# Patient Record
Sex: Male | Born: 1940 | Race: Black or African American | Hispanic: No | Marital: Married | Smoking: Former smoker
Health system: Southern US, Community
[De-identification: ages and names within clinical notes are randomized; demographics above are authoritative.]

## PROBLEM LIST (undated history)

## (undated) DIAGNOSIS — G629 Polyneuropathy, unspecified: Secondary | ICD-10-CM

## (undated) DIAGNOSIS — R569 Unspecified convulsions: Secondary | ICD-10-CM

## (undated) DIAGNOSIS — G40909 Epilepsy, unspecified, not intractable, without status epilepticus: Secondary | ICD-10-CM

## (undated) HISTORY — PX: KNEE SURGERY: SHX244

## (undated) HISTORY — DX: Epilepsy, unspecified, not intractable, without status epilepticus: G40.909

## (undated) HISTORY — PX: PENECTOMY: SHX741

## (undated) HISTORY — PX: CATARACT EXTRACTION, BILATERAL: SHX1313

## (undated) HISTORY — DX: Unspecified convulsions: R56.9

## (undated) HISTORY — DX: Polyneuropathy, unspecified: G62.9

## (undated) HISTORY — PX: TOE SURGERY: SHX1073

## (undated) HISTORY — PX: SPINE SURGERY: SHX786

## (undated) HISTORY — PX: REPAIR OF PERFORATED ULCER: SHX6065

---

## 2015-08-20 ENCOUNTER — Emergency Department (HOSPITAL_COMMUNITY): Payer: Medicare PPO

## 2015-08-20 ENCOUNTER — Emergency Department (HOSPITAL_COMMUNITY)
Admission: EM | Admit: 2015-08-20 | Discharge: 2015-08-20 | Disposition: A | Discharge status: Home / Self Care | Payer: Medicare PPO | Attending: Emergency Medicine | Admitting: Emergency Medicine

## 2015-08-20 ENCOUNTER — Encounter (HOSPITAL_COMMUNITY): Payer: Self-pay

## 2015-08-20 DIAGNOSIS — M1711 Unilateral primary osteoarthritis, right knee: Secondary | ICD-10-CM | POA: Insufficient documentation

## 2015-08-20 DIAGNOSIS — M25561 Pain in right knee: Secondary | ICD-10-CM

## 2015-08-20 MED ORDER — IBUPROFEN 400 MG PO TABS
600.0000 mg | ORAL_TABLET | Freq: Once | ORAL | Status: AC
  Administered 2015-08-20: 600 mg via ORAL
  Filled 2015-08-20: qty 1

## 2015-08-20 MED ORDER — IBUPROFEN 600 MG PO TABS: 600.0000 mg | ORAL_TABLET | Freq: Four times a day (QID) | ORAL | 0 refills | Status: DC | PRN

## 2015-08-20 NOTE — ED Notes (Signed)
Pt's been called twice without a response. Pt will be moved back to the waiting room.

## 2015-08-20 NOTE — ED Notes (Signed)
Pt has been called three times. Radiology called and informed Nurse 1st that pt was overheard stating that he was going to visit his wife on the 5th floor. Pt will remain in the waiting room.

## 2015-08-20 NOTE — Discharge Instructions (Signed)

## 2015-08-20 NOTE — ED Triage Notes (Signed)
Patient here with 2 weeks of right knee pain, denies injury. Stats he has neuropathy to lower extremities, mild swelling noted

## 2015-08-20 NOTE — ED Provider Notes (Signed)
[facility] DEPT Provider Note   CSN: [id number] Arrival date & time: 08/20/15  1714  First Provider Contact:  First MD Initiated Contact with Patient 08/20/15 1853     History   Chief Complaint Chief Complaint  Patient presents with  . Knee Pain    HPI Mario Waters is a 75 y.o. male.  The history is provided by the patient. No language interpreter was used.  Knee Pain   This is a chronic problem. The current episode started more than 1 week ago. The problem occurs constantly. The problem has been gradually worsening. The pain is present in the right knee. The quality of the pain is described as aching. The pain is at a severity of 5/10. The pain is moderate. Pertinent negatives include no numbness, full range of motion, no stiffness, no tingling and no itching. He has tried nothing for the symptoms. The treatment provided mild relief. There has been no history of extremity trauma.    History reviewed. No pertinent past medical history.  There are no active problems to display for this patient.   History reviewed. No pertinent surgical history.     Home Medications    Prior to Admission medications   Medication Sig Start Date End Date Taking? Authorizing Provider  ibuprofen (ADVIL,MOTRIN) 600 MG tablet Take 1 tablet (600 mg total) by mouth every 6 (six) hours as needed. 08/20/15   Dan Humphreys, MD    Family History No family history on file.  Social History Social History  Substance Use Topics  . Smoking status: Never Smoker  . Smokeless tobacco: Never Used  . Alcohol use Not on file     Allergies   Review of patient's allergies indicates no known allergies.   Review of Systems Review of Systems  Constitutional: Negative for chills and fever.  HENT: Negative for ear pain and sore throat.   Eyes: Negative for pain and visual disturbance.  Respiratory: Negative for cough and shortness of breath.   Cardiovascular: Negative for chest pain and  palpitations.  Gastrointestinal: Negative for abdominal pain and vomiting.  Genitourinary: Negative for dysuria and hematuria.  Musculoskeletal: Positive for joint swelling. Negative for arthralgias, back pain and stiffness.  Skin: Negative for color change, itching and rash.  Neurological: Negative for tingling, seizures, syncope and numbness.  All other systems reviewed and are negative.    Physical Exam Updated Vital Signs BP 129/67 (BP Location: Right Arm)   Pulse 64   Temp 98.4 F (36.9 C) (Oral)   Resp 18   SpO2 100%   Physical Exam  Constitutional: He appears well-developed and well-nourished.  HENT:  Head: Normocephalic and atraumatic.  Eyes: Conjunctivae are normal.  Neck: Neck supple.  Cardiovascular: Normal rate and regular rhythm.   No murmur heard. Pulmonary/Chest: Effort normal and breath sounds normal. No respiratory distress.  Abdominal: Soft. There is no tenderness.  Musculoskeletal: He exhibits no edema.       Right knee: He exhibits swelling and effusion. He exhibits normal range of motion, no deformity and no laceration.  Neurological: He is alert.  Skin: Skin is warm and dry.  Psychiatric: He has a normal mood and affect.  Nursing note and vitals reviewed.    ED Treatments / Results  Labs (all labs ordered are listed, but only abnormal results are displayed) Labs Reviewed - No data to display  EKG  EKG Interpretation None       Radiology Dg Knee Complete 4 Views Right  Result Date: 08/20/2015  CLINICAL DATA:  Right knee pain for 3 weeks, no known injury, initial encounter EXAM: RIGHT KNEE - COMPLETE 4+ VIEW COMPARISON:  None. FINDINGS: Degenerative changes are noted most marked in the medial joint space with osteophytic change and joint space narrowing. No acute fracture or dislocation is seen. Small joint effusion is noted. Diffuse vascular calcifications are seen. IMPRESSION: Degenerative change without acute abnormality. Electronically  Signed   By: Alcide Clever M.D.   On: 08/20/2015 18:04   Procedures Procedures (including critical care time)  Medications Ordered in ED Medications  ibuprofen (ADVIL,MOTRIN) tablet 600 mg (600 mg Oral Given 08/20/15 1908)     Initial Impression / Assessment and Plan / ED Course  I have reviewed the triage vital signs and the nursing notes.  Pertinent labs & imaging results that were available during my care of the patient were reviewed by me and considered in my medical decision making (see chart for details).  Clinical Course   Patient is a 75 year old gentleman with no significant past medical history with evaluation of right knee pain. He hit the pain is worsening over the past 2 weeks. No associated fall or trauma. Patient denies any fevers, chills. He does pain is worse with activity and worse near the end of the day.  Patient was told in the past and had a history of "bone on bone".  His exam reveals unremarkable vital signs. Patient is alert, oriented in no distress. Small joint effusion. No warmth to palpation. Patient with full range of motion of the joint.  X-ray with no acute findings. Right knee x-ray with changes consistent with osteoarthritis. No signs of acute fracture or malalignment.  No fever, warmth, restricted joint movement on range of motion to suggest septic joint.  Encouraged NSAIDs, rest, ice and referred to orthopedics.  Patient able to ambulate in ED at time of discharge.  Discussed case my attending, Dr. Adela Lank.  Final Clinical Impressions(s) / ED Diagnoses   Final diagnoses:  Right knee pain  Osteoarthritis of right knee, unspecified osteoarthritis type    New Prescriptions New Prescriptions   IBUPROFEN (ADVIL,MOTRIN) 600 MG TABLET    Take 1 tablet (600 mg total) by mouth every 6 (six) hours as needed.     Dan Humphreys, MD 08/20/15 1912    Melene Plan, DO 08/21/15 9562

## 2015-09-11 ENCOUNTER — Telehealth: Payer: Self-pay

## 2015-09-11 DIAGNOSIS — K219 Gastro-esophageal reflux disease without esophagitis: Secondary | ICD-10-CM | POA: Diagnosis not present

## 2015-09-11 DIAGNOSIS — G40909 Epilepsy, unspecified, not intractable, without status epilepticus: Secondary | ICD-10-CM | POA: Diagnosis not present

## 2015-09-11 DIAGNOSIS — Z23 Encounter for immunization: Secondary | ICD-10-CM | POA: Diagnosis not present

## 2015-09-11 DIAGNOSIS — G5793 Unspecified mononeuropathy of bilateral lower limbs: Secondary | ICD-10-CM | POA: Diagnosis not present

## 2015-09-11 NOTE — Telephone Encounter (Signed)
Pt stopped by the office to have medications recorded into his chart. Pt coming for NP appt on 09/12/2015 RLB

## 2015-09-12 ENCOUNTER — Institutional Professional Consult (permissible substitution): Payer: Self-pay | Admitting: Family Medicine

## 2015-09-14 ENCOUNTER — Other Ambulatory Visit: Payer: Self-pay | Admitting: Family Medicine

## 2015-09-21 ENCOUNTER — Encounter: Payer: Self-pay | Admitting: Family Medicine

## 2015-09-21 ENCOUNTER — Other Ambulatory Visit: Payer: Self-pay | Admitting: Family Medicine

## 2015-09-21 ENCOUNTER — Telehealth: Payer: Self-pay | Admitting: Internal Medicine

## 2015-09-21 ENCOUNTER — Ambulatory Visit (INDEPENDENT_AMBULATORY_CARE_PROVIDER_SITE_OTHER): Payer: Medicare PPO | Admitting: Family Medicine

## 2015-09-21 ENCOUNTER — Telehealth: Payer: Self-pay

## 2015-09-21 VITALS — BP 124/72 | HR 77 | Wt 154.6 lb

## 2015-09-21 DIAGNOSIS — G40909 Epilepsy, unspecified, not intractable, without status epilepticus: Secondary | ICD-10-CM | POA: Diagnosis not present

## 2015-09-21 DIAGNOSIS — G5793 Unspecified mononeuropathy of bilateral lower limbs: Secondary | ICD-10-CM | POA: Diagnosis not present

## 2015-09-21 DIAGNOSIS — T420X1A Poisoning by hydantoin derivatives, accidental (unintentional), initial encounter: Secondary | ICD-10-CM | POA: Insufficient documentation

## 2015-09-21 DIAGNOSIS — Z23 Encounter for immunization: Secondary | ICD-10-CM | POA: Diagnosis not present

## 2015-09-21 DIAGNOSIS — K219 Gastro-esophageal reflux disease without esophagitis: Secondary | ICD-10-CM

## 2015-09-21 DIAGNOSIS — G629 Polyneuropathy, unspecified: Secondary | ICD-10-CM | POA: Insufficient documentation

## 2015-09-21 LAB — CBC WITH DIFFERENTIAL/PLATELET
BASOS ABS: 43 {cells}/uL (ref 0–200)
Basophils Relative: 1 %
Eosinophils Absolute: 43 cells/uL (ref 15–500)
Eosinophils Relative: 1 %
HEMATOCRIT: 41.9 % (ref 38.5–50.0)
HEMOGLOBIN: 14.4 g/dL (ref 13.2–17.1)
LYMPHS ABS: 2279 {cells}/uL (ref 850–3900)
LYMPHS PCT: 53 %
MCH: 31.8 pg (ref 27.0–33.0)
MCHC: 34.4 g/dL (ref 32.0–36.0)
MCV: 92.5 fL (ref 80.0–100.0)
MONO ABS: 344 {cells}/uL (ref 200–950)
MPV: 9.8 fL (ref 7.5–12.5)
Monocytes Relative: 8 %
NEUTROS PCT: 37 %
Neutro Abs: 1591 cells/uL (ref 1500–7800)
Platelets: 272 10*3/uL (ref 140–400)
RBC: 4.53 MIL/uL (ref 4.20–5.80)
RDW: 13.7 % (ref 11.0–15.0)
WBC: 4.3 10*3/uL (ref 4.0–10.5)

## 2015-09-21 LAB — COMPREHENSIVE METABOLIC PANEL
ALBUMIN: 4.5 g/dL (ref 3.6–5.1)
ALT: 19 U/L (ref 9–46)
AST: 22 U/L (ref 10–35)
Alkaline Phosphatase: 89 U/L (ref 40–115)
BILIRUBIN TOTAL: 0.5 mg/dL (ref 0.2–1.2)
BUN: 10 mg/dL (ref 7–25)
CALCIUM: 9.4 mg/dL (ref 8.6–10.3)
CHLORIDE: 102 mmol/L (ref 98–110)
CO2: 29 mmol/L (ref 20–31)
Creat: 0.95 mg/dL (ref 0.70–1.18)
GLUCOSE: 106 mg/dL — AB (ref 65–99)
POTASSIUM: 4.8 mmol/L (ref 3.5–5.3)
Sodium: 142 mmol/L (ref 135–146)
Total Protein: 7.6 g/dL (ref 6.1–8.1)

## 2015-09-21 NOTE — Telephone Encounter (Signed)
error 

## 2015-09-21 NOTE — Telephone Encounter (Signed)
Given to vicki for urgent medical records request.

## 2015-09-21 NOTE — Telephone Encounter (Signed)
oked per vickie to refill for 90 days

## 2015-09-21 NOTE — Progress Notes (Signed)
Subjective:    Patient ID: Mario Waters, male    DOB: [birth year], 75 y.o.   MRN: [id number]  HPI Chief Complaint  Patient presents with  . Other    new pt, get established- out of seizure meds and needs refill   He is new to the practice and here to establish care. No medical records were received prior to his visit and he did not bring in medical records. He does have prescription bottles including an empty bottle of Dilantin that has refills on the bottle. States he took his last dose last night. This medication was prescribed by his neurologist in [facility].   He moved here from [facility] with his wife in July.  Previous medical care: Dr. Ladell Pier in [facility] was PCP.  Last CPE: last year  Other providers: Dr. Adele Barthel in [facility] is neurosurgeon. Had spinal surgery in 02/2014. States he has been on Gabapentin since then.  Dr. Eduardo Osier - neurologist   GERD and history of bleeding ulcers, had surgery in 1980s in [place]. No problems since.  Takes medication for reflux but cannot recall which medication. Has been out of this for several weeks. Denies having any reflux symptoms.   History of seizures. States last seizure was June 2017 in [place]. First seizure was in 1990s and states he has been taking medication since then.  States he had a workup for seizures and cannot tell me what his diagnosis is. States he has 1 seizure per year typically.  Has been taking daily Phenytoin 200 mg nightly.  States he does not know why he is taking Gabapentin. Has been taking it since his last spinal surgery.  States he had neuropathy and medication has helped with pain in hands but not for feet and leg pains.   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   Quit smoking in 1980. Smoked for 20 years, 1 ppd. denies drinking alcohol and drug use  Denies fever, chills, headache,  dizziness, vision changes, chest pain, palpitations, DOE, abdominal pain, nausea, vomiting, diarrhea, LE edema.   Reviewed allergies, medications, past medical, surgical, and social history.   Review of Systems Pertinent positives and negatives in the history of present illness.     Objective:   Physical Exam  Constitutional: He is oriented to person, place, and time. He appears well-developed and well-nourished. No distress.  HENT:  Mouth/Throat: Uvula is midline, oropharynx is clear and moist and mucous membranes are normal.  Eyes: Conjunctivae and EOM are normal. Pupils are equal, round, and reactive to light.  Neck: Normal range of motion. Neck supple.  Cardiovascular: Normal rate, regular rhythm, normal heart sounds and normal pulses.  Exam reveals no gallop and no friction rub.   No murmur heard. Pulmonary/Chest: Effort normal and breath sounds normal.  Lymphadenopathy:    He has no cervical adenopathy.       Right: No supraclavicular adenopathy present.       Left: No supraclavicular adenopathy present.  Neurological: He is alert and oriented to person, place, and time. He has normal strength and normal reflexes. He displays no tremor. No cranial nerve deficit or sensory deficit. Coordination and gait normal.  Skin: Skin is warm and dry. No rash noted. No cyanosis. No pallor.  Psychiatric: He has a normal mood and affect. His speech is normal and behavior is normal. Judgment and thought content normal. Cognition  and memory are normal.   BP 124/72   Pulse 77   Wt 154 lb 9.6 oz (70.1 kg)      Assessment & Plan:  Seizure disorder (HCC) - Plan: Ambulatory referral to Neurology  Neuropathy involving both lower extremities (HCC) - Plan: Ambulatory referral to Neurology  Gastroesophageal reflux disease, esophagitis presence not specified  Dilantin was refilled to [facility] per Vernona RiegerLaura. Patient given instructions.  Referral made to neurologist.  Plan to check dilantin  level and CBC, CMP.  Continue on gabapentin for neuropathy.  Flu shot given.  GERD- recommend that he not start daily medication as long as he is not having any issues with this.  [facility] received from [facility]ist [facility] after patient left office and shows history of dilantin toxicity causing seizure in past.  Will need to discuss him not driving when we call with lab results. He should not drive until evaluated by neurologist and for 6 months following last seizure which will be in December 2017.  Follow up pending labs and when I get medical records [facility].

## 2015-09-22 LAB — PHENYTOIN LEVEL, TOTAL: Phenytoin Lvl: 21.2 ug/mL — ABNORMAL HIGH (ref 10.0–20.0)

## 2015-10-02 ENCOUNTER — Telehealth: Payer: Self-pay | Admitting: Family Medicine

## 2015-10-02 NOTE — Telephone Encounter (Signed)
Rcvd office notes and labs from Dr Casey BurkittLynn Goetze

## 2015-10-05 ENCOUNTER — Telehealth: Payer: Self-pay | Admitting: Family Medicine

## 2015-10-05 DIAGNOSIS — N429 Disorder of prostate, unspecified: Secondary | ICD-10-CM

## 2015-10-05 DIAGNOSIS — R7889 Finding of other specified substances, not normally found in blood: Secondary | ICD-10-CM

## 2015-10-05 NOTE — Telephone Encounter (Signed)
Please call and have him come in for a lab visit next Monday or Tuesday for repeat Dilantin level due to elevated dilantin level. Also, please fine out why he is requesting that I send him to the urologist for prostate issues. What symptoms is he having and what history does he have for me to send him? I did not see any notes on this. I should see him again soon if he is having symptoms so I can see if he needs a referral or not

## 2015-10-05 NOTE — Telephone Encounter (Signed)
Spoke to patient and he states that several years ago he had to have surgery on his prostate as it was swollen and had to take an abscess out. [facility] just to make sure everything is ok but wants to have a urologist up here instead. Is it okay to refer him to urology. No symptoms and no concerns. I have put in future dilantin order in

## 2015-10-05 NOTE — Addendum Note (Signed)
Addended by: Herminio CommonsJOHNSON, SABRINA A on: 10/05/2015 09:12 AM   Modules accepted: Orders

## 2015-10-09 ENCOUNTER — Other Ambulatory Visit: Payer: Medicare PPO

## 2015-10-10 NOTE — Telephone Encounter (Signed)
Sure, he can be referred to urology for this. Thanks.

## 2015-10-11 NOTE — Telephone Encounter (Signed)
Tried to call patient but mailbox is full. I will go ahead and refer patient to urology for prostate

## 2015-10-11 NOTE — Addendum Note (Signed)
Addended by: Herminio CommonsJOHNSON, Truth Wolaver A on: 10/11/2015 09:58 AM   Modules accepted: Orders

## 2015-10-11 NOTE — Telephone Encounter (Signed)
Faxed over referral to [facility]

## 2015-10-20 ENCOUNTER — Telehealth: Payer: Self-pay | Admitting: Family Medicine

## 2015-10-20 MED ORDER — PHENYTOIN SODIUM EXTENDED 100 MG PO CAPS: 200.0000 mg | ORAL_CAPSULE | Freq: Every day | ORAL | 0 refills | Status: DC

## 2015-10-20 MED ORDER — CLONAZEPAM 0.5 MG PO TABS: 0.5000 mg | ORAL_TABLET | Freq: Every day | ORAL | 0 refills | Status: DC

## 2015-10-20 NOTE — Telephone Encounter (Signed)
Pt was notified that he needs to come in for an appt for lab. And also call neurology again to schedule appt. I will give that neurology appt on Monday when he comes in for labs. I will also call in meds to pharmacy  [facility] (Seizures).Please call them at [phone] to schedule appointment.   Address: [address]                [place], [place] [address]

## 2015-10-20 NOTE — Telephone Encounter (Signed)
Pt stopped by office, he needs refill on Phenytoin sodium 100 States he only has enough for tonight and also Questioned why he did not have refills  Also needs refill on clonazepam 0.5 mg #30  [facility]

## 2015-10-20 NOTE — Telephone Encounter (Signed)
He did not come back to have his dilantin level checked as requested in order to continue refilling this for him. His level was high last time it was checked. He needs to come in for this. I will give him one refill for now and I cannot refill it going further until he gets this checked. Also, please check and make sure he has an appointment set up to see the neurologist. I would am referring him because of recent seizure on this medication. He should not be driving and I discussed this with him.  Please send in one refill.

## 2015-10-20 NOTE — Telephone Encounter (Signed)
Called in med to pharmacy  

## 2015-10-23 ENCOUNTER — Other Ambulatory Visit: Payer: Medicare PPO

## 2015-10-23 DIAGNOSIS — R7889 Finding of other specified substances, not normally found in blood: Secondary | ICD-10-CM

## 2015-10-26 LAB — PHENYTOIN LEVEL, FREE AND TOTAL: PHENYTOIN FREE: 1.8 mg/L (ref 1.0–2.0)

## 2015-11-06 ENCOUNTER — Telehealth: Payer: Self-pay | Admitting: Internal Medicine

## 2015-11-06 NOTE — Telephone Encounter (Signed)
Pt was told today in office that we are not going to refill his dilantin anymore that his neurologist needs to fill it. [facility] says that [facility].  I told him if not he need to call Neurology here (which would be [facility] that we referred him too) to get an appt.

## 2015-11-13 ENCOUNTER — Encounter: Payer: Self-pay | Admitting: Family Medicine

## 2015-11-15 ENCOUNTER — Encounter: Payer: Self-pay | Admitting: Family Medicine

## 2016-01-29 ENCOUNTER — Ambulatory Visit (INDEPENDENT_AMBULATORY_CARE_PROVIDER_SITE_OTHER): Payer: Medicare PPO | Admitting: Neurology

## 2016-01-29 ENCOUNTER — Encounter: Payer: Self-pay | Admitting: Neurology

## 2016-01-29 VITALS — BP 133/72 | HR 69 | Ht 71.0 in | Wt 159.0 lb

## 2016-01-29 DIAGNOSIS — Z5181 Encounter for therapeutic drug level monitoring: Secondary | ICD-10-CM

## 2016-01-29 DIAGNOSIS — G5793 Unspecified mononeuropathy of bilateral lower limbs: Secondary | ICD-10-CM

## 2016-01-29 DIAGNOSIS — G40909 Epilepsy, unspecified, not intractable, without status epilepticus: Secondary | ICD-10-CM | POA: Diagnosis not present

## 2016-01-29 NOTE — Patient Instructions (Signed)
   Begin taking Vitamin D 2000 IU daily.

## 2016-01-29 NOTE — Progress Notes (Signed)
Reason for visit: Seizures  Referring physician: Dr. Bonnielee Haff Mario Waters is a 76 y.o. male  History of present illness:  Mario Waters is a 76 year old right-handed black male with a history of seizures that began in the 46s. The patient has recently moved from the [place], [place] area in July 2017. The patient had been followed through a neurologist in that area until just recently. The patient last had a seizure in May 2017, it had been about 2 years prior since he had a seizure. He has been treated with Dilantin for the seizures, he is also on gabapentin for a peripheral neuropathy. He does operate a motor vehicle without difficulty. He indicates that he gets a sensation of generalized weakness prior to onset of the seizure, then he will lose consciousness with generalized jerking. He denies having any problems with tongue biting or incontinence of the bowels or the bladder. He reports no focal numbness or weakness of the face, arms, or legs. He does have some discomfort in the legs, and he has some gait instability. He denies any family history of seizures. He is sent to this office for further evaluation. A Dilantin level done in August 2017 was slightly toxic at 21, but a free level was 1.8.  Past Medical History:  Diagnosis Date  . Neuropathy (HCC)   . Seizure disorder (HCC)    since 14s. taking Dilantin 200mg  nightly    Past Surgical History:  Procedure Laterality Date  . CATARACT EXTRACTION, BILATERAL    . KNEE SURGERY    . PENECTOMY    . REPAIR OF PERFORATED ULCER    . SPINE SURGERY     Cervical laminectomy  . TOE SURGERY      Family History  Problem Relation Age of Onset  . Cancer Sister     breast  . Cancer Sister   . Seizures Neg Hx     Social history:  reports that he quit smoking about 38 years ago. He has never used smokeless tobacco. He reports that he does not drink alcohol or use drugs.  Medications:  Prior to Admission medications     Medication Sig Start Date End Date Taking? Authorizing Provider  Cholecalciferol (VITAMIN D) 2000 units CAPS Take 2,000 Units by mouth daily.   Yes Historical Provider, MD  clonazePAM (KLONOPIN) 0.5 MG tablet Take 1 tablet (0.5 mg total) by mouth at bedtime. 10/20/15  Yes Avanell Shackleton, NP  gabapentin (NEURONTIN) 300 MG capsule Take 300 mg by mouth 2 (two) times daily.   Yes Historical Provider, MD  ibuprofen (ADVIL,MOTRIN) 600 MG tablet Take 1 tablet (600 mg total) by mouth every 6 (six) hours as needed. 08/20/15  Yes Dan Humphreys, MD  Multiple Vitamins-Minerals (PRESERVISION AREDS 2) CAPS Take 2 capsules by mouth.   Yes Historical Provider, MD  Omega-3 Fatty Acids (OMEGA-3 FISH OIL) 300 MG CAPS Take by mouth daily.   Yes Historical Provider, MD  phenytoin (DILANTIN) 100 MG ER capsule Take 2 capsules (200 mg total) by mouth at bedtime. 10/20/15  Yes Avanell Shackleton, NP     No Known Allergies  ROS:  Out of a complete 14 system review of symptoms, the patient complains only of the following symptoms, and all other reviewed systems are negative.  Snoring Joint pain, achy muscles Numbness, weakness, seizure  Blood pressure 133/72, pulse 69, height 5\' 11"  (1.803 m), weight 159 lb (72.1 kg).  Physical Exam  General: The patient is alert and  cooperative at the time of the examination.  Eyes: Pupils are equal, round, and reactive to light. Discs are flat bilaterally.  Neck: The neck is supple, no carotid bruits are noted.  Respiratory: The respiratory examination is clear.  Cardiovascular: The cardiovascular examination reveals a regular rate and rhythm, no obvious murmurs or rubs are noted.  Skin: Extremities are without significant edema.  Neurologic Exam  Mental status: The patient is alert and oriented x 3 at the time of the examination. The patient has apparent normal recent and remote memory, with an apparently normal attention span and concentration ability.  Cranial nerves:  Facial symmetry is present. There is good sensation of the face to pinprick and soft touch bilaterally. The strength of the facial muscles and the muscles to head turning and shoulder shrug are normal bilaterally. Speech is well enunciated, no aphasia or dysarthria is noted. Extraocular movements are full. Visual fields are full. The tongue is midline, and the patient has symmetric elevation of the soft palate. No obvious hearing deficits are noted.  Motor: The motor testing reveals 5 over 5 strength of all 4 extremities. Good symmetric motor tone is noted throughout.  Sensory: Sensory testing is intact to pinprick, soft touch, vibration sensation, and position sense on all 4 extremities, with exception of some decreased edition sense in both feet. No evidence of extinction is noted.  Coordination: Cerebellar testing reveals good finger-nose-finger and heel-to-shin bilaterally.  Gait and station: Gait is slightly wide-based. Tandem gait is unsteady. Romberg is negative. No drift is seen.  Reflexes: Deep tendon reflexes are symmetric, but are depressed bilaterally. Toes are downgoing bilaterally.   Assessment/Plan:  1. History seizures  2. Peripheral neuropathy  3. Mild gait instability  The patient will be sent for blood work today. He will go on vitamin D supplementation while he is on Dilantin. He is still operating a motor vehicle. He will follow-up in about 6 months. He will contact our office if a seizure recurrence is noted.  Mario Waters. Mario Willis MD 01/29/2016 2:53 PM  [facility] [address] [place], [place] [id number]  Phone [phone] Fax [phone]

## 2016-01-30 ENCOUNTER — Telehealth: Payer: Self-pay | Admitting: Neurology

## 2016-01-30 LAB — CBC WITH DIFFERENTIAL/PLATELET
BASOS: 1 %
Basophils Absolute: 0 10*3/uL (ref 0.0–0.2)
EOS (ABSOLUTE): 0.1 10*3/uL (ref 0.0–0.4)
Eos: 2 %
Hematocrit: 39.2 % (ref 37.5–51.0)
Hemoglobin: 13.2 g/dL (ref 13.0–17.7)
Immature Grans (Abs): 0 10*3/uL (ref 0.0–0.1)
Immature Granulocytes: 0 %
Lymphocytes Absolute: 2.6 10*3/uL (ref 0.7–3.1)
Lymphs: 49 %
MCH: 31.1 pg (ref 26.6–33.0)
MCHC: 33.7 g/dL (ref 31.5–35.7)
MCV: 92 fL (ref 79–97)
MONOS ABS: 0.4 10*3/uL (ref 0.1–0.9)
Monocytes: 8 %
NEUTROS ABS: 2.1 10*3/uL (ref 1.4–7.0)
Neutrophils: 40 %
PLATELETS: 272 10*3/uL (ref 150–379)
RBC: 4.25 x10E6/uL (ref 4.14–5.80)
RDW: 14 % (ref 12.3–15.4)
WBC: 5.3 10*3/uL (ref 3.4–10.8)

## 2016-01-30 LAB — PHENYTOIN LEVEL, TOTAL: Phenytoin (Dilantin), Serum: 28 ug/mL (ref 10.0–20.0)

## 2016-01-30 LAB — COMPREHENSIVE METABOLIC PANEL
A/G RATIO: 1.7 (ref 1.2–2.2)
ALT: 19 IU/L (ref 0–44)
AST: 23 IU/L (ref 0–40)
Albumin: 4.7 g/dL (ref 3.5–4.8)
Alkaline Phosphatase: 100 IU/L (ref 39–117)
BUN/Creatinine Ratio: 11 (ref 10–24)
BUN: 9 mg/dL (ref 8–27)
Bilirubin Total: 0.4 mg/dL (ref 0.0–1.2)
CALCIUM: 9 mg/dL (ref 8.6–10.2)
CO2: 27 mmol/L (ref 18–29)
Chloride: 97 mmol/L (ref 96–106)
Creatinine, Ser: 0.79 mg/dL (ref 0.76–1.27)
GFR, EST AFRICAN AMERICAN: 100 mL/min/{1.73_m2} (ref >59)
GFR, EST NON AFRICAN AMERICAN: 87 mL/min/{1.73_m2} (ref >59)
GLOBULIN, TOTAL: 2.8 g/dL (ref 1.5–4.5)
Glucose: 86 mg/dL (ref 65–99)
POTASSIUM: 4.9 mmol/L (ref 3.5–5.2)
SODIUM: 140 mmol/L (ref 134–144)
TOTAL PROTEIN: 7.5 g/dL (ref 6.0–8.5)

## 2016-01-30 MED ORDER — PHENYTOIN SODIUM EXTENDED 100 MG PO CAPS: 100.0000 mg | ORAL_CAPSULE | Freq: Every day | ORAL | 0 refills | Status: DC

## 2016-01-30 MED ORDER — PHENYTOIN 50 MG PO CHEW: 50.0000 mg | CHEWABLE_TABLET | Freq: Every day | ORAL | 3 refills | Status: DC

## 2016-01-30 NOTE — Telephone Encounter (Signed)
I called patient. The patient had a Dilantin level of 28, this does represent a trough level as he takes his medications in the evening hours. The patient was slightly staggery on clinical examination. We will cut him back to 150 mg of Dilantin at night, recheck blood levels in 3 weeks. I have called in the 50 mg tablets, I talked with the patient concerning the dose change.

## 2016-02-20 ENCOUNTER — Telehealth: Payer: Self-pay | Admitting: Neurology

## 2016-02-20 DIAGNOSIS — Z5181 Encounter for therapeutic drug level monitoring: Secondary | ICD-10-CM

## 2016-02-20 NOTE — Telephone Encounter (Signed)
I called the patient. He is to come in to get a Dilantin level checked, I will call him with the results when we get them.

## 2016-02-26 ENCOUNTER — Other Ambulatory Visit (INDEPENDENT_AMBULATORY_CARE_PROVIDER_SITE_OTHER): Payer: Self-pay

## 2016-02-26 DIAGNOSIS — Z5181 Encounter for therapeutic drug level monitoring: Secondary | ICD-10-CM

## 2016-02-26 DIAGNOSIS — Z0289 Encounter for other administrative examinations: Secondary | ICD-10-CM

## 2016-02-27 ENCOUNTER — Telehealth: Payer: Self-pay | Admitting: *Deleted

## 2016-02-27 LAB — PHENYTOIN LEVEL, TOTAL: PHENYTOIN (DILANTIN), SERUM: 13.9 ug/mL (ref 10.0–20.0)

## 2016-02-27 NOTE — Telephone Encounter (Signed)
Called and spoke to pt about lab results per CW,MD note. He verbalized understanding.

## 2016-02-27 NOTE — Telephone Encounter (Signed)
-----   Message from York Spanielharles K Willis, MD sent at 02/27/2016  7:24 AM EST -----  Dilantin level is therapeutic, no change in dosing. Please call the patient. ----- Message ----- From: Nell RangeInterface, [facility] Lab Results In Sent: 02/27/2016   5:39 AM To: York Spanielharles K Willis, MD

## 2016-05-14 ENCOUNTER — Encounter (HOSPITAL_COMMUNITY): Payer: Self-pay | Admitting: *Deleted

## 2016-05-14 ENCOUNTER — Emergency Department (HOSPITAL_COMMUNITY)
Admission: EM | Admit: 2016-05-14 | Discharge: 2016-05-14 | Disposition: A | Discharge status: Home / Self Care | Payer: Medicare PPO | Attending: Emergency Medicine | Admitting: Emergency Medicine

## 2016-05-14 ENCOUNTER — Emergency Department (HOSPITAL_COMMUNITY): Payer: Medicare PPO

## 2016-05-14 DIAGNOSIS — Z87891 Personal history of nicotine dependence: Secondary | ICD-10-CM | POA: Insufficient documentation

## 2016-05-14 DIAGNOSIS — G40909 Epilepsy, unspecified, not intractable, without status epilepticus: Secondary | ICD-10-CM | POA: Insufficient documentation

## 2016-05-14 DIAGNOSIS — R030 Elevated blood-pressure reading, without diagnosis of hypertension: Secondary | ICD-10-CM | POA: Insufficient documentation

## 2016-05-14 DIAGNOSIS — R4182 Altered mental status, unspecified: Secondary | ICD-10-CM | POA: Diagnosis not present

## 2016-05-14 DIAGNOSIS — E876 Hypokalemia: Secondary | ICD-10-CM | POA: Diagnosis not present

## 2016-05-14 LAB — COMPREHENSIVE METABOLIC PANEL
ALBUMIN: 4.1 g/dL (ref 3.5–5.0)
ALT: 24 U/L (ref 17–63)
ANION GAP: 10 (ref 5–15)
AST: 28 U/L (ref 15–41)
Alkaline Phosphatase: 96 U/L (ref 38–126)
BILIRUBIN TOTAL: 1.1 mg/dL (ref 0.3–1.2)
BUN: 11 mg/dL (ref 6–20)
CO2: 27 mmol/L (ref 22–32)
Calcium: 9 mg/dL (ref 8.9–10.3)
Chloride: 100 mmol/L — ABNORMAL LOW (ref 101–111)
Creatinine, Ser: 0.98 mg/dL (ref 0.61–1.24)
GFR calc Af Amer: >60 mL/min (ref >60)
Glucose, Bld: 96 mg/dL (ref 65–99)
POTASSIUM: 3.4 mmol/L — AB (ref 3.5–5.1)
Sodium: 137 mmol/L (ref 135–145)
TOTAL PROTEIN: 7.4 g/dL (ref 6.5–8.1)

## 2016-05-14 LAB — CBC WITH DIFFERENTIAL/PLATELET
BASOS PCT: 1 %
Basophils Absolute: 0 10*3/uL (ref 0.0–0.1)
EOS ABS: 0.1 10*3/uL (ref 0.0–0.7)
EOS PCT: 1 %
HCT: 41.1 % (ref 39.0–52.0)
Hemoglobin: 14 g/dL (ref 13.0–17.0)
LYMPHS PCT: 46 %
Lymphs Abs: 2.3 10*3/uL (ref 0.7–4.0)
MCH: 32 pg (ref 26.0–34.0)
MCHC: 34.1 g/dL (ref 30.0–36.0)
MCV: 93.8 fL (ref 78.0–100.0)
MONO ABS: 0.3 10*3/uL (ref 0.1–1.0)
Monocytes Relative: 7 %
NEUTROS ABS: 2.2 10*3/uL (ref 1.7–7.7)
Neutrophils Relative %: 45 %
Platelets: 243 10*3/uL (ref 150–400)
RBC: 4.38 MIL/uL (ref 4.22–5.81)
RDW: 12.8 % (ref 11.5–15.5)
WBC: 4.9 10*3/uL (ref 4.0–10.5)

## 2016-05-14 LAB — URINALYSIS, ROUTINE W REFLEX MICROSCOPIC
BILIRUBIN URINE: NEGATIVE
GLUCOSE, UA: NEGATIVE mg/dL
HGB URINE DIPSTICK: NEGATIVE
Ketones, ur: 5 mg/dL — AB
Leukocytes, UA: NEGATIVE
Nitrite: NEGATIVE
PROTEIN: NEGATIVE mg/dL
Specific Gravity, Urine: 1.011 (ref 1.005–1.030)
pH: 5 (ref 5.0–8.0)

## 2016-05-14 LAB — PHENYTOIN LEVEL, TOTAL: PHENYTOIN LVL: 14.2 ug/mL (ref 10.0–20.0)

## 2016-05-14 MED ORDER — PHENYTOIN SODIUM EXTENDED 100 MG PO CAPS
300.0000 mg | ORAL_CAPSULE | Freq: Once | ORAL | Status: AC
  Administered 2016-05-14: 300 mg via ORAL
  Filled 2016-05-14: qty 3

## 2016-05-14 MED ORDER — POTASSIUM CHLORIDE CRYS ER 20 MEQ PO TBCR
40.0000 meq | EXTENDED_RELEASE_TABLET | Freq: Once | ORAL | Status: AC
  Administered 2016-05-14: 40 meq via ORAL
  Filled 2016-05-14: qty 2

## 2016-05-14 NOTE — ED Notes (Signed)
Got patient undress on the monitor waiting for provider 

## 2016-05-14 NOTE — ED Provider Notes (Signed)
[facility]EMERGENCY DEPT Provider Note   CSN: [id number] Arrival date & time: 05/14/16  1478     History   Chief Complaint Chief Complaint  Patient presents with  . Altered Mental Status  Level V caveat altered mental status. I attempted to call patient's home numbers, no answer at history stay and from EMS  HPI Mario Waters is a 76 y.o. male. Patient reportedly had 2 seizures sometime between last night and this morning. Patient admits to having one seizure. He denies noncompliance with medications. He denies pain anywhere and states "I feel all right" no treatment prior to coming here  HPI  Past Medical History:  Diagnosis Date  . Neuropathy   . Seizure disorder (HCC)    since 89s. taking Dilantin  nightly    Patient Active Problem List   Diagnosis Date Noted  . Neuropathy involving both lower extremities 09/21/2015  . Seizure disorder (HCC) 09/21/2015  . Dilantin toxicity 09/21/2015    Past Surgical History:  Procedure Laterality Date  . CATARACT EXTRACTION, BILATERAL    . KNEE SURGERY    . PENECTOMY    . REPAIR OF PERFORATED ULCER    . SPINE SURGERY     Cervical laminectomy  . TOE SURGERY         Home Medications    Prior to Admission medications   Medication Sig Start Date End Date Taking? Authorizing Provider  Cholecalciferol (VITAMIN D) 2000 units CAPS Take 2,000 Units by mouth daily.    Historical Provider, MD  clonazePAM (KLONOPIN) 0.5 MG tablet Take 1 tablet (0.5 mg total) by mouth at bedtime. 10/20/15   Vickie L Henson, NP-C  gabapentin (NEURONTIN) 300 MG capsule Take 300 mg by mouth 2 (two) times daily.    Historical Provider, MD  ibuprofen (ADVIL,MOTRIN) 600 MG tablet Take 1 tablet (600 mg total) by mouth every 6 (six) hours as needed. 08/20/15   Dan Humphreys, MD  Multiple Vitamins-Minerals (PRESERVISION AREDS 2) CAPS Take 2 capsules by mouth.    Historical Provider, MD  Omega-3 Fatty Acids (OMEGA-3 FISH OIL) 300 MG CAPS Take by mouth daily.     Historical Provider, MD  phenytoin (DILANTIN) 100 MG ER capsule Take 1 capsule (100 mg total) by mouth at bedtime. 01/30/16   York Spaniel, MD  phenytoin (DILANTIN) 50 MG tablet Chew 1 tablet (50 mg total) by mouth at bedtime. 01/30/16   York Spaniel, MD    Family History Family History  Problem Relation Age of Onset  . Cancer Sister     breast  . Cancer Sister   . Seizures Neg Hx     Social History Social History  Substance Use Topics  . Smoking status: Former Smoker    Quit date: 1980  . Smokeless tobacco: Never Used  . Alcohol use No     Comment: Quit 1980   Denies drug use  Allergies   Patient has no known allergies.   Review of Systems Review of Systems  Unable to perform ROS: Mental status change  Neurological: Positive for seizures.     Physical Exam Updated Vital Signs BP (!) 148/70 (BP Location: Right Arm)   Pulse 87   Temp 97.8 F (36.6 C) (Oral)   Resp 20   SpO2 99%   Physical Exam  Constitutional: He appears well-developed and well-nourished.  HENT:  Head: Normocephalic and atraumatic.  Eyes: Conjunctivae are normal. Pupils are equal, round, and reactive to light.  Neck: Neck supple. No tracheal deviation present.  No thyromegaly present.  Cardiovascular: Normal rate and regular rhythm.   No murmur heard. Pulmonary/Chest: Effort normal and breath sounds normal.  Abdominal: Soft. Bowel sounds are normal. He exhibits no distension. There is no tenderness.  Musculoskeletal: Normal range of motion. He exhibits no edema or tenderness.  Neurological: He is alert. Coordination normal.  Oriented to name and hospital does not know month or year. Cranial nerves II through XII grossly intact. DTRs symmetric bilaterally knee jerk ankle jerk and biceps toes to order bilaterally motor strength grossly 5 over 5 overall follow simple commands  Skin: Skin is warm and dry. No rash noted.  Psychiatric: He has a normal mood and affect.  Nursing note and vitals  reviewed.  ED Treatments / Results  Labs (all labs ordered are listed, but only abnormal results are displayed) Labs Reviewed - No data to display  EKG  EKG Interpretation None      ED ECG REPORT   Date: 05/14/2016  Rate: 90  Rhythm: normal sinus rhythm  QRS Axis: normal  Intervals: normal  ST/T Wave abnormalities: nonspecific T wave changes  Conduction Disutrbances:none  Narrative Interpretation:   Old EKG Reviewed: none available  I have personally reviewed the EKG tracing and agree with the computerized printout as noted. Radiology No results found.  Procedures Procedures (including critical care time)  Medications Ordered in ED Medications - No data to display  Results for orders placed or performed during the hospital encounter of 05/14/16  Comprehensive metabolic panel  Result Value Ref Range   Sodium 137 135 - 145 mmol/L   Potassium 3.4 (L) 3.5 - 5.1 mmol/L   Chloride 100 (L) 101 - 111 mmol/L   CO2 27 22 - 32 mmol/L   Glucose, Bld 96 65 - 99 mg/dL   BUN 11 6 - 20 mg/dL   Creatinine, Ser 4.09 0.61 - 1.24 mg/dL   Calcium 9.0 8.9 - 81.1 mg/dL   Total Protein 7.4 6.5 - 8.1 g/dL   Albumin 4.1 3.5 - 5.0 g/dL   AST 28 15 - 41 U/L   ALT 24 17 - 63 U/L   Alkaline Phosphatase 96 38 - 126 U/L   Total Bilirubin 1.1 0.3 - 1.2 mg/dL   GFR calc non Af Amer >60 >60 mL/min   GFR calc Af Amer >60 >60 mL/min   Anion gap 10 5 - 15  CBC with Differential/Platelet  Result Value Ref Range   WBC 4.9 4.0 - 10.5 K/uL   RBC 4.38 4.22 - 5.81 MIL/uL   Hemoglobin 14.0 13.0 - 17.0 g/dL   HCT 91.4 78.2 - 95.6 %   MCV 93.8 78.0 - 100.0 fL   MCH 32.0 26.0 - 34.0 pg   MCHC 34.1 30.0 - 36.0 g/dL   RDW 21.3 08.6 - 57.8 %   Platelets 243 150 - 400 K/uL   Neutrophils Relative % 45 %   Neutro Abs 2.2 1.7 - 7.7 K/uL   Lymphocytes Relative 46 %   Lymphs Abs 2.3 0.7 - 4.0 K/uL   Monocytes Relative 7 %   Monocytes Absolute 0.3 0.1 - 1.0 K/uL   Eosinophils Relative 1 %    Eosinophils Absolute 0.1 0.0 - 0.7 K/uL   Basophils Relative 1 %   Basophils Absolute 0.0 0.0 - 0.1 K/uL  Phenytoin level, total  Result Value Ref Range   Phenytoin Lvl 14.2 10.0 - 20.0 ug/mL  Urinalysis, Routine w reflex microscopic  Result Value Ref Range   Color, Urine  YELLOW YELLOW   APPearance CLEAR CLEAR   Specific Gravity, Urine 1.011 1.005 - 1.030   pH 5.0 5.0 - 8.0   Glucose, UA NEGATIVE NEGATIVE mg/dL   Hgb urine dipstick NEGATIVE NEGATIVE   Bilirubin Urine NEGATIVE NEGATIVE   Ketones, ur 5 (A) NEGATIVE mg/dL   Protein, ur NEGATIVE NEGATIVE mg/dL   Nitrite NEGATIVE NEGATIVE   Leukocytes, UA NEGATIVE NEGATIVE   Ct Head Wo Contrast  Result Date: 05/14/2016 CLINICAL DATA:  Status post seizure x2 last night. EXAM: CT HEAD WITHOUT CONTRAST TECHNIQUE: Contiguous axial images were obtained from the base of the skull through the vertex without intravenous contrast. COMPARISON:  None. FINDINGS: Brain: Appears normal without hemorrhage, infarct, mass lesion, mass effect, midline shift or abnormal extra-axial fluid collection. No hydrocephalus or pneumocephalus. Vascular: Atherosclerosis noted. Skull: Intact. Sinuses/Orbits: Negative. Other: None. IMPRESSION: No acute abnormality. Atherosclerosis. Electronically Signed   By: Drusilla Kanner M.D.   On: 05/14/2016 10:39   Initial Impression / Assessment and Plan / ED Course  I have reviewed the triage vital signs and the nursing notes.  Pertinent labs & imaging results that were available during my care of the patient were reviewed by me and considered in my medical decision making (see chart for details).     Further history obtained from patient's son by telephone at 11 AM patient had 2 seizures last night. He may have missed some of his seizure medications. He was confused after the seizures. He is normally fully oriented.   2:30 PM he is alert and ambulates without difficulty. Not lightheaded on standing. He is now fully oriented  and knows that date hospital and name and situation. He'll be given Dilantin 300 mg by mouth prior to discharge as well as potassium chloride 40 mEq by mouth. I suggest that he follow-up with his neurologist for possible adjustment of medications. Blood pressure recheck 3 weeks Final Clinical Impressions(s) / ED Diagnoses  Diagnosis #1 seizure disorder #2 hypokalemia Final diagnoses:  None  #3 elevated blood pressure  New Prescriptions New Prescriptions   No medications on file     Doug Sou, MD 05/14/16 1445

## 2016-05-14 NOTE — ED Notes (Signed)
Papers reviewed with patient and medication given. He verbalizes understanding and leaving with family today

## 2016-05-14 NOTE — ED Triage Notes (Signed)
Pt arrives from home via [facility]. Pt family states his LSN was "sometime yesterday". Family also states he had 2 seizures last night. Pt only oriented to self and is pleasantly confused. Pt unable to answer any questions appropriately, but is alert and responsive to speech.

## 2016-05-14 NOTE — ED Notes (Signed)
Patient transported to CT 

## 2016-05-14 NOTE — Discharge Instructions (Signed)
Take all of your medications as prescribed. Call your neurologist today or tomorrow to schedule next available appointment. Your medications may need to be adjusted. [facility] neurologist that your blood Dilantin level today was 14.2. You were given an extra dose of Dilantin 300 mg. Your blood pressure should be rechecked within the next 3 weeks. Today's was elevated at 150/72

## 2016-05-15 ENCOUNTER — Ambulatory Visit (INDEPENDENT_AMBULATORY_CARE_PROVIDER_SITE_OTHER): Payer: Medicare PPO | Admitting: Neurology

## 2016-05-15 ENCOUNTER — Encounter: Payer: Self-pay | Admitting: Neurology

## 2016-05-15 VITALS — BP 164/84 | HR 86 | Ht 71.0 in | Wt 160.5 lb

## 2016-05-15 DIAGNOSIS — G40909 Epilepsy, unspecified, not intractable, without status epilepticus: Secondary | ICD-10-CM

## 2016-05-15 MED ORDER — LEVETIRACETAM 500 MG PO TABS: 500.0000 mg | ORAL_TABLET | Freq: Two times a day (BID) | ORAL | 5 refills | Status: DC

## 2016-05-15 NOTE — Progress Notes (Signed)
Reason for visit: Seizures  Mario Waters is an 76 y.o. male  History of present illness:  Mario. Waters is a 76 year old right-handed black male with a history of seizures. The patient had been on Dilantin taking 150 mg daily. He was seen in the emergency room yesterday with 2 seizures that occurred at home, each one lasting about 2 minutes. The patient had no warning of the seizure. He has not missed any doses of medication and he has not had any problems with any other illnesses. The patient did hit his head, he did not sustain significant injury with the seizure. He comes to the office today for an evaluation. The Dilantin level in the emergency room was 14.1. The patient does report a history of neuropathy symptoms with crawling sensations and tingling in the feet. This does not keep him awake at night.  Past Medical History:  Diagnosis Date  . Neuropathy   . Seizure disorder (HCC)    since 44s. taking Dilantin  nightly    Past Surgical History:  Procedure Laterality Date  . CATARACT EXTRACTION, BILATERAL    . KNEE SURGERY    . PENECTOMY    . REPAIR OF PERFORATED ULCER    . SPINE SURGERY     Cervical laminectomy  . TOE SURGERY      Family History  Problem Relation Age of Onset  . Cancer Sister     breast  . Cancer Sister   . Seizures Neg Hx     Social history:  reports that he quit smoking about 38 years ago. He has never used smokeless tobacco. He reports that he does not drink alcohol or use drugs.   No Known Allergies  Medications:  Prior to Admission medications   Medication Sig Start Date End Date Taking? Authorizing Provider  Cholecalciferol (VITAMIN D) 2000 units CAPS Take 2,000 Units by mouth daily.   Yes Historical Provider, MD  clonazePAM (KLONOPIN) 0.5 MG tablet Take 1 tablet (0.5 mg total) by mouth at bedtime. 10/20/15  Yes Vickie L Henson, NP-C  gabapentin (NEURONTIN) 300 MG capsule Take 300 mg by mouth 2 (two) times daily.   Yes Historical  Provider, MD  ibuprofen (ADVIL,MOTRIN) 600 MG tablet Take 1 tablet (600 mg total) by mouth every 6 (six) hours as needed. 08/20/15  Yes Dan Humphreys, MD  Multiple Vitamins-Minerals (PRESERVISION AREDS 2) CAPS Take 2 capsules by mouth.   Yes Historical Provider, MD  Omega-3 Fatty Acids (OMEGA-3 FISH OIL) 300 MG CAPS Take by mouth daily.   Yes Historical Provider, MD  phenytoin (DILANTIN) 100 MG ER capsule Take 1 capsule (100 mg total) by mouth at bedtime. 01/30/16  Yes York Spaniel, MD  phenytoin (DILANTIN) 50 MG tablet Chew 1 tablet (50 mg total) by mouth at bedtime. 01/30/16  Yes York Spaniel, MD    ROS:  Out of a complete 14 system review of symptoms, the patient complains only of the following symptoms, and all other reviewed systems are negative.  Seizure  Blood pressure (!) 164/84, pulse 86, height  (1.803 m), weight 160 lb 8 oz (72.8 kg).  Physical Exam  General: The patient is alert and cooperative at the time of the examination.  Skin: No significant peripheral edema is noted.   Neurologic Exam  Mental status: The patient is alert and oriented x 3 at the time of the examination. The patient has apparent normal recent and remote memory, with an apparently normal attention span and concentration  ability.   Cranial nerves: Facial symmetry is present. Speech is normal, no aphasia or dysarthria is noted. Extraocular movements are full. Visual fields are full.  Motor: The patient has good strength in all 4 extremities.  Sensory examination: Soft touch sensation is symmetric on the face, arms, and legs.  Coordination: The patient has good finger-nose-finger and heel-to-shin bilaterally.  Gait and station: The patient has a normal gait. Tandem gait is unsteady. Romberg is negative. No drift is seen.  Reflexes: Deep tendon reflexes are symmetric.   Assessment/Plan:  1. Seizure with recent recurrence  The patient has a therapeutic Dilantin level. The dose was  reduced several months ago as the level was toxic at 28. The patient will be placed on low-dose Keppra, beginning at 250 mg twice daily for 2 weeks then go to 500 mg twice daily. He will follow-up in 4 months. He will call if there are any problems on the new medication. He is not to operate a motor vehicle for 6 months from the last seizure.   Marlan Palau MD 05/15/2016 10:24 AM  [facility] [address] [place], [place] [address]  Phone [phone] Fax [phone]

## 2016-05-15 NOTE — Patient Instructions (Signed)
   We will start Keppra 500 mg tablet, start 1/2 tablet twice a day for 2 weeks, then take one twice a day.  Look out for drowsiness and irritability.

## 2016-05-30 ENCOUNTER — Telehealth: Payer: Self-pay | Admitting: Neurology

## 2016-05-30 NOTE — Telephone Encounter (Signed)
Pt called said PCP recommended he see Dr Lacretia NicksW for neuropathy in his feet. The patient was taking gabapentin (NEURONTIN) 300 MG capsule up until about 1 mth ago, said it was not helping any more and stopped it. An appt has been scheduled for 10/10/16, he is wanting to be seen sooner.

## 2016-05-30 NOTE — Telephone Encounter (Signed)
Tried calling pt back to scheduled appt. VM not set up, unable to LVM.  Can offer him open appt on 06/03/16 if he calls back, those need to be fillled. Thank you!

## 2016-05-30 NOTE — Telephone Encounter (Signed)
Called and spoke with pt. Verified he scheduled appt for Monday 5/14 at 230pm. Advised him to check in by 2pm. He verbalized understanding.

## 2016-06-03 ENCOUNTER — Ambulatory Visit (INDEPENDENT_AMBULATORY_CARE_PROVIDER_SITE_OTHER): Payer: Medicare PPO | Admitting: Neurology

## 2016-06-03 ENCOUNTER — Encounter: Payer: Self-pay | Admitting: Neurology

## 2016-06-03 VITALS — BP 121/71 | HR 83 | Ht 71.0 in | Wt 156.5 lb

## 2016-06-03 DIAGNOSIS — G40909 Epilepsy, unspecified, not intractable, without status epilepticus: Secondary | ICD-10-CM | POA: Diagnosis not present

## 2016-06-03 DIAGNOSIS — E538 Deficiency of other specified B group vitamins: Secondary | ICD-10-CM

## 2016-06-03 DIAGNOSIS — G5793 Unspecified mononeuropathy of bilateral lower limbs: Secondary | ICD-10-CM

## 2016-06-03 NOTE — Progress Notes (Signed)
Reason for visit: Seizures  Mario Waters is an 76 y.o. male  History of present illness:  Mr. Mario Waters is a 76 year old right-handed black male with a history of seizures. The patient recently was lowered on the dose of Dilantin due to toxicity, Keppra was added given a recent seizure. The patient comes in today for different problem. He has a history of a peripheral neuropathy with numbness in both legs up to the knees. He denies any pain whatsoever, but he was on gabapentin, and the numbness continued to get worse and he eventually stopped the medication. The patient has had cervical spine surgery previously, he indicates that after neck surgery his hand numbness improved. His leg numbness has never gotten better. He does have some gait instability, he denies any recent falls. He denies issues controlling the bowels or the bladder. He has been on chronic Dilantin therapy.  Past Medical History:  Diagnosis Date  . Neuropathy   . Seizure disorder (HCC)    since 41990s. taking Dilantin 200mg  nightly  . Seizures (HCC)     Past Surgical History:  Procedure Laterality Date  . CATARACT EXTRACTION, BILATERAL    . KNEE SURGERY    . PENECTOMY    . REPAIR OF PERFORATED ULCER    . SPINE SURGERY     Cervical laminectomy  . TOE SURGERY      Family History  Problem Relation Age of Onset  . Cancer Sister        breast  . Cancer Sister   . Seizures Neg Hx     Social history:  reports that he quit smoking about 38 years ago. He has never used smokeless tobacco. He reports that he does not drink alcohol or use drugs.   No Known Allergies  Medications:  Prior to Admission medications   Medication Sig Start Date End Date Taking? Authorizing Provider  albuterol (PROVENTIL HFA;VENTOLIN HFA) 108 (90 Base) MCG/ACT inhaler Inhale into the lungs. 04/27/16  Yes [provider]  Cholecalciferol (VITAMIN D) 2000 units CAPS Take 2,000 Units by mouth daily.   Yes [provider]    gabapentin (NEURONTIN) 300 MG capsule Take 300 mg by mouth 2 (two) times daily.   Yes [provider]  ibuprofen (ADVIL,MOTRIN) 600 MG tablet Take 1 tablet (600 mg total) by mouth every 6 (six) hours as needed. 08/20/15  Yes Dan HumphreysIrick, Michael, MD  levETIRAcetam (KEPPRA) 500 MG tablet Take 1 tablet (500 mg total) by mouth 2 (two) times daily. 05/15/16  Yes York SpanielWillis, Irvin Lizama K, MD  Multiple Vitamins-Minerals (PRESERVISION AREDS 2) CAPS Take 2 capsules by mouth.   Yes [provider]  Omega-3 Fatty Acids (OMEGA-3 FISH OIL) 300 MG CAPS Take by mouth daily.   Yes [provider]  phenytoin (DILANTIN) 100 MG ER capsule Take 1 capsule (100 mg total) by mouth at bedtime. 01/30/16  Yes York SpanielWillis, Amorita Vanrossum K, MD  phenytoin (DILANTIN) 50 MG tablet Chew 1 tablet (50 mg total) by mouth at bedtime. 01/30/16  Yes York SpanielWillis, Lysha Schrade K, MD  VENTOLIN HFA 108 [phone](90 Base) MCG/ACT inhaler  04/29/16  Yes [provider]  clonazePAM (KLONOPIN) 0.5 MG tablet Take 1 tablet (0.5 mg total) by mouth at bedtime. Patient not taking: Reported on 06/03/2016 10/20/15   Avanell ShackletonHenson, Vickie L, NP-C    ROS:  Out of a complete 14 system review of symptoms, the patient complains only of the following symptoms, and all other reviewed systems are negative.  Numbness History of seizures  Blood pressure 121/71, pulse 83, height 5\' 11"  (1.803 m), weight 156 lb 8 oz (71 kg).  Physical Exam  General: The patient is alert and cooperative at the time of the examination.  Skin: No significant peripheral edema is noted.   Neurologic Exam  Mental status: The patient is alert and oriented x 3 at the time of the examination. The patient has apparent normal recent and remote memory, with an apparently normal attention span and concentration ability.   Cranial nerves: Facial symmetry is present. Speech is normal, no aphasia or dysarthria is noted. Extraocular movements are full. Visual fields are full.  Motor: The patient has  good strength in all 4 extremities.  Sensory examination: Soft touch sensation is symmetric on the face, arms, and legs. There is a stocking pattern pinprick sensory deficit in the distal two thirds of the legs bilaterally. Vibration sensation is symmetric on both legs.  Coordination: The patient has good finger-nose-finger and heel-to-shin bilaterally.  Gait and station: The patient has a normal gait. Tandem gait is unsteady. Romberg is negative. No drift is seen.  Reflexes: Deep tendon reflexes are symmetric, but are depressed.   Assessment/Plan:  1. History of seizures  2. Numbness in the legs, probable peripheral neuropathy  It is possible that Dilantin may be causing a peripheral neuropathy in this patient. The patient is on Keppra, he was only taking one 500 mg tablet daily, he is to go to 1 twice daily. We will see him back in about 3 months, we will consider increasing the Keppra to 750 milligrams twice daily and consider a slow taper off of the Dilantin. The patient will be sent for blood work today to evaluate him for an etiology of his peripheral neuropathy. The patient wanted medications for his numbness, but I indicated that without pain, medications do not help numbness.   Marlan Palau MD 06/03/2016 2:38 PM  [facility] [address] [place], [place] [address]  Phone [phone] Fax [phone]

## 2016-06-06 ENCOUNTER — Telehealth: Payer: Self-pay

## 2016-06-06 LAB — MULTIPLE MYELOMA PANEL, SERUM
Albumin SerPl Elph-Mcnc: 4.5 g/dL — ABNORMAL HIGH (ref 2.9–4.4)
Albumin/Glob SerPl: 1.2 (ref 0.7–1.7)
Alpha 1: 0.3 g/dL (ref 0.0–0.4)
Alpha2 Glob SerPl Elph-Mcnc: 0.8 g/dL (ref 0.4–1.0)
B-GLOBULIN SERPL ELPH-MCNC: 1.3 g/dL (ref 0.7–1.3)
GAMMA GLOB SERPL ELPH-MCNC: 1.4 g/dL (ref 0.4–1.8)
Globulin, Total: 3.8 g/dL (ref 2.2–3.9)
IGG (IMMUNOGLOBIN G), SERUM: 1223 mg/dL (ref 700–1600)
IgA/Immunoglobulin A, Serum: 348 mg/dL (ref 61–437)
IgM (Immunoglobulin M), Srm: 62 mg/dL (ref 15–143)
Total Protein: 8.3 g/dL (ref 6.0–8.5)

## 2016-06-06 LAB — ANGIOTENSIN CONVERTING ENZYME: Angio Convert Enzyme: 37 U/L (ref 14–82)

## 2016-06-06 LAB — B. BURGDORFI ANTIBODIES

## 2016-06-06 LAB — RPR: RPR Ser Ql: NONREACTIVE

## 2016-06-06 LAB — ANA W/REFLEX: Anti Nuclear Antibody(ANA): NEGATIVE

## 2016-06-06 LAB — RHEUMATOID FACTOR: Rhuematoid fact SerPl-aCnc: 12.3 IU/mL (ref 0.0–13.9)

## 2016-06-06 LAB — VITAMIN B12: Vitamin B-12: 604 pg/mL (ref 232–1245)

## 2016-06-06 LAB — SEDIMENTATION RATE: Sed Rate: 2 mm/hr (ref 0–30)

## 2016-06-06 NOTE — Telephone Encounter (Signed)
-----   Message from York Spanielharles K Willis, MD sent at 06/06/2016  7:52 AM EDT -----   The blood work results are unremarkable. Please call the patient.  ----- Message ----- From: Nell RangeInterface, [facility] Lab Results In Sent: 06/04/2016   7:42 AM To: York Spanielharles K Willis, MD

## 2016-06-06 NOTE — Telephone Encounter (Signed)
Unable to leave vm on listed number mail box not set up was calling about lab results.

## 2016-06-11 NOTE — Telephone Encounter (Signed)
Tried to call to give lab results.Unable to leave vm on phone was not activated.

## 2016-06-12 NOTE — Telephone Encounter (Signed)
Tried to call patients listed number to give lab work. Vm again said not set up.Unable to leave message.

## 2016-06-26 DIAGNOSIS — M170 Bilateral primary osteoarthritis of knee: Secondary | ICD-10-CM | POA: Insufficient documentation

## 2016-10-10 ENCOUNTER — Ambulatory Visit: Payer: Medicare PPO | Admitting: Neurology

## 2016-11-06 ENCOUNTER — Emergency Department (HOSPITAL_COMMUNITY)
Admission: EM | Admit: 2016-11-06 | Discharge: 2016-11-06 | Disposition: A | Discharge status: Home / Self Care | Payer: Medicare PPO | Attending: Emergency Medicine | Admitting: Emergency Medicine

## 2016-11-06 ENCOUNTER — Emergency Department (HOSPITAL_COMMUNITY): Payer: Medicare PPO

## 2016-11-06 ENCOUNTER — Encounter (HOSPITAL_COMMUNITY): Payer: Self-pay | Admitting: *Deleted

## 2016-11-06 DIAGNOSIS — R51 Headache: Secondary | ICD-10-CM | POA: Insufficient documentation

## 2016-11-06 DIAGNOSIS — Z981 Arthrodesis status: Secondary | ICD-10-CM | POA: Diagnosis not present

## 2016-11-06 DIAGNOSIS — Z79899 Other long term (current) drug therapy: Secondary | ICD-10-CM | POA: Insufficient documentation

## 2016-11-06 MED ORDER — ACETAMINOPHEN 325 MG PO TABS
650.0000 mg | ORAL_TABLET | Freq: Once | ORAL | Status: AC
  Administered 2016-11-06: 650 mg via ORAL
  Filled 2016-11-06: qty 2

## 2016-11-06 MED ORDER — TRAMADOL HCL 50 MG PO TABS
50.0000 mg | ORAL_TABLET | Freq: Once | ORAL | Status: AC
  Administered 2016-11-06: 50 mg via ORAL
  Filled 2016-11-06: qty 1
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
## 2016-11-06 NOTE — Discharge Instructions (Signed)
You may experience worsening diffuse body soreness tomorrow. Please take ibuprofen/tylenol as needed for pain. Please followup with primary doctor at first availability.

## 2017-01-06 ENCOUNTER — Other Ambulatory Visit: Payer: Self-pay | Admitting: *Deleted

## 2017-01-06 MED ORDER — LEVETIRACETAM 500 MG PO TABS: 500.0000 mg | ORAL_TABLET | Freq: Two times a day (BID) | ORAL | 2 refills | Status: DC

## 2017-02-25 ENCOUNTER — Telehealth: Payer: Self-pay | Admitting: Neurology

## 2017-02-25 MED ORDER — PHENYTOIN 50 MG PO CHEW: 50.0000 mg | CHEWABLE_TABLET | Freq: Every day | ORAL | 0 refills | Status: DC

## 2017-02-25 NOTE — Telephone Encounter (Signed)
Pt called requesting a refill for phenytoin (DILANTIN) 50 MG tablet sent to [id number] - [place], [place] - [address]

## 2017-02-25 NOTE — Telephone Encounter (Signed)
E-scribed refill to pt pharmacy as requested. 

## 2017-03-28 ENCOUNTER — Ambulatory Visit: Payer: Medicare PPO | Admitting: Neurology

## 2017-03-28 ENCOUNTER — Encounter: Payer: Self-pay | Admitting: Neurology

## 2017-03-28 VITALS — BP 133/66 | HR 80 | Ht 71.0 in | Wt 163.0 lb

## 2017-03-28 DIAGNOSIS — Z5181 Encounter for therapeutic drug level monitoring: Secondary | ICD-10-CM | POA: Diagnosis not present

## 2017-03-28 DIAGNOSIS — G603 Idiopathic progressive neuropathy: Secondary | ICD-10-CM

## 2017-03-28 MED ORDER — PHENYTOIN SODIUM EXTENDED 100 MG PO CAPS: 100.0000 mg | ORAL_CAPSULE | Freq: Every day | ORAL | 3 refills | Status: DC

## 2017-03-28 MED ORDER — LEVETIRACETAM 500 MG PO TABS: 500.0000 mg | ORAL_TABLET | Freq: Two times a day (BID) | ORAL | 3 refills | Status: DC

## 2017-03-28 MED ORDER — PHENYTOIN 50 MG PO CHEW: 50.0000 mg | CHEWABLE_TABLET | Freq: Every day | ORAL | 3 refills | Status: DC

## 2017-03-28 NOTE — Patient Instructions (Signed)
   Stop the gabapentin. We will check EMG and NCV study to look at the nerve function of the legs.
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
## 2017-03-31 ENCOUNTER — Telehealth: Payer: Self-pay | Admitting: *Deleted

## 2017-03-31 LAB — CBC WITH DIFFERENTIAL/PLATELET
Basophils Absolute: 0.1 10*3/uL (ref 0.0–0.2)
Basos: 1 %
EOS (ABSOLUTE): 0.3 10*3/uL (ref 0.0–0.4)
EOS: 6 %
HEMATOCRIT: 39.1 % (ref 37.5–51.0)
HEMOGLOBIN: 13.2 g/dL (ref 13.0–17.7)
Immature Grans (Abs): 0 10*3/uL (ref 0.0–0.1)
Immature Granulocytes: 0 %
LYMPHS ABS: 2.3 10*3/uL (ref 0.7–3.1)
LYMPHS: 53 %
MCH: 32.4 pg (ref 26.6–33.0)
MCHC: 33.8 g/dL (ref 31.5–35.7)
MCV: 96 fL (ref 79–97)
Monocytes Absolute: 0.4 10*3/uL (ref 0.1–0.9)
Monocytes: 8 %
Neutrophils Absolute: 1.4 10*3/uL (ref 1.4–7.0)
Neutrophils: 32 %
Platelets: 267 10*3/uL (ref 150–379)
RBC: 4.08 x10E6/uL — AB (ref 4.14–5.80)
RDW: 13.5 % (ref 12.3–15.4)
WBC: 4.4 10*3/uL (ref 3.4–10.8)

## 2017-03-31 LAB — PHENYTOIN LEVEL, TOTAL: Phenytoin (Dilantin), Serum: 13.8 ug/mL (ref 10.0–20.0)

## 2017-03-31 LAB — COMPREHENSIVE METABOLIC PANEL
ALBUMIN: 4.6 g/dL (ref 3.5–4.8)
ALT: 18 IU/L (ref 0–44)
AST: 25 IU/L (ref 0–40)
Albumin/Globulin Ratio: 1.6 (ref 1.2–2.2)
Alkaline Phosphatase: 108 IU/L (ref 39–117)
BUN / CREAT RATIO: 10 (ref 10–24)
BUN: 9 mg/dL (ref 8–27)
Bilirubin Total: 0.2 mg/dL (ref 0.0–1.2)
CO2: 26 mmol/L (ref 20–29)
CREATININE: 0.9 mg/dL (ref 0.76–1.27)
Calcium: 9.1 mg/dL (ref 8.6–10.2)
Chloride: 101 mmol/L (ref 96–106)
GFR calc non Af Amer: 82 mL/min/{1.73_m2} (ref >59)
GFR, EST AFRICAN AMERICAN: 94 mL/min/{1.73_m2} (ref >59)
GLUCOSE: 100 mg/dL — AB (ref 65–99)
Globulin, Total: 2.9 g/dL (ref 1.5–4.5)
Potassium: 5 mmol/L (ref 3.5–5.2)
Sodium: 142 mmol/L (ref 134–144)
TOTAL PROTEIN: 7.5 g/dL (ref 6.0–8.5)

## 2017-03-31 LAB — LEVETIRACETAM LEVEL: LEVETIRACETAM: 10 ug/mL (ref 10.0–40.0)

## 2017-03-31 NOTE — Telephone Encounter (Signed)
Tried calling pt. Went to VM. VM full, unable to LVM. Will try again later.  Okay to relay labs unremarkable, therapeutic levels of Keppra and Dilantin, no change in dosing.if pt calls

## 2017-03-31 NOTE — Telephone Encounter (Signed)
-----   Message from York Spanielharles K Willis, MD sent at 03/31/2017  7:07 AM EDT -----   The blood work results are unremarkable. Therapeutic levels of Keppra and Dilantin are noted, no change in dosing. Please call the patient. ----- Message ----- From: Nell RangeInterface, [facility] Lab Results In Sent: 03/29/2017   7:42 AM To: York Spanielharles K Willis, MD

## 2017-04-01 ENCOUNTER — Encounter: Payer: Self-pay | Admitting: *Deleted

## 2017-04-01 NOTE — Telephone Encounter (Signed)
Tried calling pt again. Mailbox full, unable to LVM. Will send letter to patient about results.

## 2017-04-15 ENCOUNTER — Encounter: Payer: Medicare PPO | Admitting: Neurology

## 2017-04-29 ENCOUNTER — Encounter: Payer: Self-pay | Admitting: Neurology

## 2017-04-29 ENCOUNTER — Ambulatory Visit (INDEPENDENT_AMBULATORY_CARE_PROVIDER_SITE_OTHER): Payer: Medicare PPO | Admitting: Neurology

## 2017-04-29 ENCOUNTER — Ambulatory Visit: Payer: Medicare PPO | Admitting: Neurology

## 2017-04-29 DIAGNOSIS — G603 Idiopathic progressive neuropathy: Secondary | ICD-10-CM

## 2017-04-29 MED ORDER — DULOXETINE HCL 30 MG PO CPEP: 30.0000 mg | ORAL_CAPSULE | Freq: Every day | ORAL | 3 refills | Status: DC

## 2017-04-29 NOTE — Progress Notes (Signed)
Please refer to EMG and nerve conduction study procedure note. 

## 2017-04-29 NOTE — Procedures (Signed)
     HISTORY:  Mario Waters is a 77 year old gentleman with a history of paresthesias affecting the lower extremities and hands.  The patient reports that the discomfort is more prominent at nighttime.  He is being evaluated for a possible neuropathy.  NERVE CONDUCTION STUDIES:  Nerve conduction studies were performed on the right upper extremity.  The distal motor latency for the right median nerve was prolonged with a normal motor amplitude.  The distal motor latency and motor amplitude for the right ulnar nerve was normal.  Nerve conduction velocities for the right median and ulnar nerves were normal.  The sensory latency for the right radial nerve was prolonged, with prolongation of the right median and ulnar sensory latencies as well.  The right ulnar F-wave latency was prolonged.  Nerve conduction studies were performed on both lower extremities.  The distal motor latencies for the peroneal and posterior tibial nerves were within normal limits bilaterally with low motor amplitudes for these nerves bilaterally.  Slowing was seen for the peroneal and posterior tibial nerves bilaterally.  The right sural sensory latency was normal but was slightly prolonged on the left.  The right peroneal sensory latency was normal but was slightly prolonged on the left.  The posterior tibial F wave latencies were prolonged bilaterally.  EMG STUDIES:  EMG study was performed on the right lower extremity:  The tibialis anterior muscle reveals 2 to 5K motor units with decreased recruitment. No fibrillations or positive waves were seen. The peroneus tertius muscle reveals 2 to 5K motor units with decreased recruitment. No fibrillations or positive waves were seen. The medial gastrocnemius muscle reveals 1 to 3K motor units with full recruitment. No fibrillations or positive waves were seen. The vastus lateralis muscle reveals 2 to 4K motor units with full recruitment. No fibrillations or positive waves were  seen. The iliopsoas muscle reveals 2 to 4K motor units with full recruitment. No fibrillations or positive waves were seen. The biceps femoris muscle (long head) reveals 2 to 4K motor units with full recruitment. No fibrillations or positive waves were seen. The lumbosacral paraspinal muscles were tested at 3 levels, and revealed no abnormalities of insertional activity at all 3 levels tested. There was good relaxation.   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  EMG evaluation of the right lower extremity does show some mild primarily chronic distal signs of denervation consistent with the diagnosis of peripheral neuropathy.  There is no evidence of an overlying lumbosacral radiculopathy.  Marlan Palau. Keith Monico Sudduth MD 04/29/2017 10:22 AM  [facility] [address] [place], [place] [address]  Phone [phone] Fax [phone]

## 2017-04-29 NOTE — Progress Notes (Addendum)
The patient comes in for EMG and nerve conduction study evaluation today.  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  The patient does indicate that he has tingling in the legs that is worse in the evenings, gabapentin did not help this, we will try low-dose Cymbalta.  The patient has already had blood work to look for etiologies of neuropathy.    MNC    Nerve / Sites Muscle Latency Ref. Amplitude Ref. Rel Amp Segments Distance Velocity Ref. Area    ms ms mV mV %  cm m/s m/s mVms  R Median - APB     Wrist APB 4.2 ?4.4 4.6 ?4.0 100 Wrist - APB 7   18.1     Upper arm APB 9.3  4.0  87.6 Upper arm - Wrist 25 49 ?49 17.5  R Ulnar - ADM     Wrist ADM 3.1 ?3.3 7.3 ?6.0 100 Wrist - ADM 7   24.1     B.Elbow ADM 7.8  6.9  94.6 B.Elbow - Wrist 24 51 ?49 24.2     A.Elbow ADM 10.0  6.3  91.2 A.Elbow - B.Elbow 10 46 ?49 22.2         A.Elbow - Wrist      R Peroneal - EDB     Ankle EDB 4.8 ?6.5 1.5 ?2.0 100 Ankle - EDB 9   4.0     Fib head EDB 14.0  1.4  94.8 Fib head - Ankle 36 39 ?44 2.7     Pop fossa EDB 16.5  1.4  101 Pop fossa - Fib head 10 40 ?44 3.0         Pop fossa - Ankle      L Peroneal - EDB     Ankle EDB 5.5 ?6.5 1.9 ?2.0 100 Ankle - EDB 9   5.3     Fib head EDB 15.0  1.5  75.5 Fib head - Ankle 36 38 ?44 4.8     Pop fossa EDB 17.7  1.5  102 Pop fossa - Fib head 10 38 ?44 5.5         Pop fossa - Ankle      R Tibial - AH     Ankle AH 5.0 ?5.8 2.1 ?4.0 100 Ankle - AH 9   6.2     Pop fossa AH 15.4  1.8  85.5 Pop fossa - Ankle 40 38 ?41 4.3  L Tibial - AH     Ankle AH 5.8 ?5.8 3.1 ?4.0 100 Ankle - AH 9   7.2     Pop fossa AH 17.2  2.7  85.1 Pop fossa - Ankle 40 35 ?41 8.3                 SNC    Nerve / Sites Rec. Site Peak Lat Ref.  Amp Ref. Segments Distance    ms ms V V  cm  R Radial - Anatomical snuff box (Forearm)     Forearm Wrist 3.2 ?2.9 11 ?15 Forearm -  Wrist 10  R Sural - Ankle (Calf)     Calf Ankle 4.2 ?4.4 2 ?6 Calf - Ankle 14  L Sural - Ankle (Calf)     Calf Ankle 4.6 ?4.4 2 ?6 Calf - Ankle 14  R Superficial peroneal - Ankle     Lat leg Ankle 4.2 ?4.4 3 ?6  Lat leg - Ankle 14  L Superficial peroneal - Ankle     Lat leg Ankle 4.4 ?4.4 3 ?6 Lat leg - Ankle 14  R Median - Orthodromic (Dig II, Mid palm)     Dig II Wrist 4.0 ?3.4 7 ?10 Dig II - Wrist 13  R Ulnar - Orthodromic, (Dig V, Mid palm)     Dig V Wrist 4.0 ?3.1 3 ?5 Dig V - Wrist 1611                   F  Wave    Nerve F Lat Ref.   ms ms  R Tibial - AH 62.2 ?56.0  L Tibial - AH 67.0 ?56.0  R Ulnar - ADM 33.0 ?32.0           EMG full

## 2017-05-14 ENCOUNTER — Encounter: Payer: Self-pay | Admitting: Nurse Practitioner
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
## 2017-10-07 ENCOUNTER — Ambulatory Visit: Payer: Medicare PPO | Admitting: Adult Health

## 2017-10-07 ENCOUNTER — Encounter: Payer: Self-pay | Admitting: Nurse Practitioner

## 2017-10-07 ENCOUNTER — Ambulatory Visit: Payer: Medicare PPO | Admitting: Nurse Practitioner

## 2017-10-07 VITALS — BP 128/70 | HR 71 | Ht 71.0 in | Wt 160.0 lb

## 2017-10-07 DIAGNOSIS — G603 Idiopathic progressive neuropathy: Secondary | ICD-10-CM | POA: Diagnosis not present

## 2017-10-07 DIAGNOSIS — G40909 Epilepsy, unspecified, not intractable, without status epilepticus: Secondary | ICD-10-CM

## 2017-10-07 MED ORDER — DULOXETINE HCL 30 MG PO CPEP: 30.0000 mg | ORAL_CAPSULE | Freq: Every day | ORAL | 6 refills | Status: DC

## 2017-10-07 NOTE — Progress Notes (Signed)
I have read the note, and I agree with the clinical assessment and plan.  Hale Chalfin K Areona Homer   

## 2017-10-07 NOTE — Progress Notes (Signed)
I have read the note, and I agree with the clinical assessment and plan.  Mario Waters   

## 2017-10-07 NOTE — Patient Instructions (Signed)
Continue Dilantin at current dose Continue Keppra at current dose Continue Cymbalta for neuropathy Follow-up in 6 months

## 2017-12-08 DIAGNOSIS — N400 Enlarged prostate without lower urinary tract symptoms: Secondary | ICD-10-CM | POA: Insufficient documentation

## 2018-02-09 ENCOUNTER — Telehealth: Payer: Self-pay | Admitting: Nurse Practitioner

## 2018-02-09 NOTE — Telephone Encounter (Signed)
Pt wife(on [place]unice) is asking for a call to discuss pt's Neuropathy medications

## 2018-02-10 NOTE — Telephone Encounter (Addendum)
Called wife, on [place] , and she asked what medication patient is on for neuropathy. I advised her he is on cymbalta. She stated he ran out of medication. I advised he has refills until March and needs to call pharmacy to refill. Confirmed the pharmacy where Refill was sent is correct. She  verbalized understanding, appreciation.
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
## 2018-04-08 ENCOUNTER — Ambulatory Visit: Payer: Medicare PPO | Admitting: Nurse Practitioner

## 2018-04-25 ENCOUNTER — Other Ambulatory Visit: Payer: Self-pay | Admitting: Neurology

## 2018-05-10 ENCOUNTER — Other Ambulatory Visit: Payer: Self-pay | Admitting: Neurology

## 2018-07-06 ENCOUNTER — Telehealth: Payer: Self-pay | Admitting: Nurse Practitioner

## 2018-07-06 NOTE — Telephone Encounter (Signed)
6-15 Pt has called and gave verbal consent to file insurance for [employer] vv cell phone carrier & # confirmed  Pt understands that although there may be some limitations with this type of visit, we will take all precautions to reduce any security or privacy concerns.  Pt understands that this will be treated like an in office visit and we will file with pt's insurance, and there may be a patient responsible charge related to this service. Holden **Husband was not able to confirm if text was received, he will call back if it did not come thru
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
## 2018-07-13 ENCOUNTER — Encounter: Payer: Self-pay | Admitting: Neurology

## 2018-07-13 ENCOUNTER — Other Ambulatory Visit: Payer: Self-pay

## 2018-07-13 ENCOUNTER — Ambulatory Visit (INDEPENDENT_AMBULATORY_CARE_PROVIDER_SITE_OTHER): Payer: Medicare PPO | Admitting: Neurology

## 2018-07-13 DIAGNOSIS — G40909 Epilepsy, unspecified, not intractable, without status epilepticus: Secondary | ICD-10-CM

## 2018-07-13 DIAGNOSIS — G603 Idiopathic progressive neuropathy: Secondary | ICD-10-CM

## 2018-07-13 MED ORDER — DULOXETINE HCL 30 MG PO CPEP: 30.0000 mg | ORAL_CAPSULE | Freq: Every day | ORAL | 6 refills | Status: DC

## 2018-07-13 MED ORDER — LEVETIRACETAM 500 MG PO TABS: 500.0000 mg | ORAL_TABLET | Freq: Two times a day (BID) | ORAL | 3 refills | Status: DC

## 2018-07-13 MED ORDER — PHENYTOIN 50 MG PO CHEW: CHEWABLE_TABLET | ORAL | 0 refills | Status: DC

## 2018-07-13 MED ORDER — PHENYTOIN SODIUM EXTENDED 100 MG PO CAPS: 100.0000 mg | ORAL_CAPSULE | Freq: Every day | ORAL | 3 refills | Status: DC

## 2018-07-13 NOTE — Addendum Note (Signed)
Addended by: Inis Sizer D on: 07/13/2018 01:29 PM   Modules accepted: Orders

## 2018-07-13 NOTE — Progress Notes (Signed)
I have read the note, and I agree with the clinical assessment and plan.  Laquitha Heslin K Ravinder Hofland   

## 2018-07-15 ENCOUNTER — Telehealth: Payer: Self-pay | Admitting: *Deleted

## 2018-07-15 LAB — COMPREHENSIVE METABOLIC PANEL
ALT: 26 IU/L (ref 0–44)
AST: 25 IU/L (ref 0–40)
Albumin/Globulin Ratio: 1.5 (ref 1.2–2.2)
Albumin: 4.4 g/dL (ref 3.7–4.7)
Alkaline Phosphatase: 111 IU/L (ref 39–117)
BUN/Creatinine Ratio: 12 (ref 10–24)
BUN: 12 mg/dL (ref 8–27)
Bilirubin Total: 0.3 mg/dL (ref 0.0–1.2)
CO2: 27 mmol/L (ref 20–29)
Calcium: 9 mg/dL (ref 8.6–10.2)
Chloride: 102 mmol/L (ref 96–106)
Creatinine, Ser: 0.98 mg/dL (ref 0.76–1.27)
GFR calc Af Amer: 84 mL/min/{1.73_m2} (ref >59)
GFR calc non Af Amer: 73 mL/min/{1.73_m2} (ref >59)
Globulin, Total: 2.9 g/dL (ref 1.5–4.5)
Glucose: 82 mg/dL (ref 65–99)
Potassium: 4.9 mmol/L (ref 3.5–5.2)
Sodium: 144 mmol/L (ref 134–144)
Total Protein: 7.3 g/dL (ref 6.0–8.5)

## 2018-07-15 LAB — CBC WITH DIFFERENTIAL/PLATELET
Basophils Absolute: 0.1 10*3/uL (ref 0.0–0.2)
Basos: 1 %
EOS (ABSOLUTE): 0.1 10*3/uL (ref 0.0–0.4)
Eos: 2 %
Hematocrit: 42.5 % (ref 37.5–51.0)
Hemoglobin: 14.6 g/dL (ref 13.0–17.7)
Immature Grans (Abs): 0 10*3/uL (ref 0.0–0.1)
Immature Granulocytes: 0 %
Lymphocytes Absolute: 3.2 10*3/uL — ABNORMAL HIGH (ref 0.7–3.1)
Lymphs: 55 %
MCH: 31.9 pg (ref 26.6–33.0)
MCHC: 34.4 g/dL (ref 31.5–35.7)
MCV: 93 fL (ref 79–97)
Monocytes Absolute: 0.4 10*3/uL (ref 0.1–0.9)
Monocytes: 7 %
Neutrophils Absolute: 2.1 10*3/uL (ref 1.4–7.0)
Neutrophils: 35 %
Platelets: 267 10*3/uL (ref 150–450)
RBC: 4.57 x10E6/uL (ref 4.14–5.80)
RDW: 12.1 % (ref 11.6–15.4)
WBC: 6 10*3/uL (ref 3.4–10.8)

## 2018-07-15 LAB — LEVETIRACETAM LEVEL: Levetiracetam Lvl: 7.9 ug/mL — ABNORMAL LOW (ref 10.0–40.0)

## 2018-07-15 LAB — PHENYTOIN LEVEL, TOTAL: Phenytoin (Dilantin), Serum: 19.4 ug/mL (ref 10.0–20.0)

## 2018-07-15 NOTE — Telephone Encounter (Signed)
-----   Message from Suzzanne Cloud, NP sent at 07/15/2018  8:02 AM EDT ----- Please call the patient. His Keppra level came back slightly low at 7.9. He is taking Keppra 500 mg twice daily. Tell him I discussed with Dr. Jannifer Franklin and we will continue on current dose since he continues to do well. His last seizure was in 2018, while on Dilantin, which is the reason Keppra was added. His Dilantin level looks good. All other labs within normal limits. He has follow-up with Dr. Jannifer Franklin in a few months.

## 2018-07-15 NOTE — Telephone Encounter (Signed)
Attempted to call and busy signal.

## 2018-07-15 NOTE — Telephone Encounter (Signed)
I spoke to pt and relayed that his lab results were discussed with Dr. Jannifer Franklin and per Butler Denmark, NP.  Keppra level 7.9 will continue both keppra 500mg  po bid and dilantin 150mg  po po qhs.  3 prescriptions sent to pharmacy (levetiracetam 500mg  tablets and phenytoin 100mg  and 50mg  tabs).  He verbalized understanding.

## 2018-08-18 ENCOUNTER — Other Ambulatory Visit: Payer: Self-pay | Admitting: Neurology

## 2019-01-20 ENCOUNTER — Telehealth: Payer: Self-pay

## 2019-01-20 NOTE — Telephone Encounter (Signed)
Called pt to inform them that Dr.Willis will not be in office and we will cancel their appt. Please expect a r/s call.   Busy signal.

## 2019-01-27 ENCOUNTER — Ambulatory Visit: Payer: Medicare PPO | Admitting: Neurology

## 2019-02-01 ENCOUNTER — Ambulatory Visit: Payer: Medicare PPO | Admitting: Neurology

## 2019-02-01 ENCOUNTER — Other Ambulatory Visit: Payer: Self-pay

## 2019-02-22 DIAGNOSIS — S72009A Fracture of unspecified part of neck of unspecified femur, initial encounter for closed fracture: Secondary | ICD-10-CM

## 2019-02-22 HISTORY — DX: Fracture of unspecified part of neck of unspecified femur, initial encounter for closed fracture: S72.009A

## 2019-03-03 ENCOUNTER — Emergency Department (HOSPITAL_COMMUNITY): Payer: Medicare PPO

## 2019-03-03 ENCOUNTER — Other Ambulatory Visit: Payer: Self-pay

## 2019-03-03 ENCOUNTER — Encounter (HOSPITAL_COMMUNITY): Payer: Self-pay

## 2019-03-03 ENCOUNTER — Inpatient Hospital Stay (HOSPITAL_COMMUNITY)
Admission: EM | Admit: 2019-03-03 | Discharge: 2019-03-05 | DRG: 482 | Disposition: A | Discharge status: Home Health | Payer: Medicare PPO | Attending: Student in an Organized Health Care Education/Training Program | Admitting: Student in an Organized Health Care Education/Training Program

## 2019-03-03 DIAGNOSIS — R42 Dizziness and giddiness: Secondary | ICD-10-CM | POA: Diagnosis present

## 2019-03-03 DIAGNOSIS — W1839XA Other fall on same level, initial encounter: Secondary | ICD-10-CM | POA: Diagnosis present

## 2019-03-03 DIAGNOSIS — F419 Anxiety disorder, unspecified: Secondary | ICD-10-CM | POA: Diagnosis present

## 2019-03-03 DIAGNOSIS — Y92009 Unspecified place in unspecified non-institutional (private) residence as the place of occurrence of the external cause: Secondary | ICD-10-CM | POA: Diagnosis not present

## 2019-03-03 DIAGNOSIS — G40909 Epilepsy, unspecified, not intractable, without status epilepticus: Secondary | ICD-10-CM | POA: Diagnosis present

## 2019-03-03 DIAGNOSIS — Z20822 Contact with and (suspected) exposure to covid-19: Secondary | ICD-10-CM | POA: Diagnosis present

## 2019-03-03 DIAGNOSIS — S72002A Fracture of unspecified part of neck of left femur, initial encounter for closed fracture: Secondary | ICD-10-CM | POA: Diagnosis present

## 2019-03-03 DIAGNOSIS — Z79899 Other long term (current) drug therapy: Secondary | ICD-10-CM | POA: Diagnosis not present

## 2019-03-03 DIAGNOSIS — Z87891 Personal history of nicotine dependence: Secondary | ICD-10-CM

## 2019-03-03 DIAGNOSIS — S72009A Fracture of unspecified part of neck of unspecified femur, initial encounter for closed fracture: Secondary | ICD-10-CM | POA: Diagnosis present

## 2019-03-03 DIAGNOSIS — M810 Age-related osteoporosis without current pathological fracture: Secondary | ICD-10-CM | POA: Diagnosis present

## 2019-03-03 DIAGNOSIS — G4700 Insomnia, unspecified: Secondary | ICD-10-CM | POA: Diagnosis present

## 2019-03-03 DIAGNOSIS — F329 Major depressive disorder, single episode, unspecified: Secondary | ICD-10-CM | POA: Diagnosis present

## 2019-03-03 DIAGNOSIS — Z419 Encounter for procedure for purposes other than remedying health state, unspecified: Secondary | ICD-10-CM

## 2019-03-03 DIAGNOSIS — R112 Nausea with vomiting, unspecified: Secondary | ICD-10-CM | POA: Diagnosis present

## 2019-03-03 DIAGNOSIS — W19XXXA Unspecified fall, initial encounter: Secondary | ICD-10-CM

## 2019-03-03 DIAGNOSIS — Z8781 Personal history of (healed) traumatic fracture: Secondary | ICD-10-CM

## 2019-03-03 LAB — BASIC METABOLIC PANEL
Anion gap: 13 (ref 5–15)
BUN: 16 mg/dL (ref 8–23)
CO2: 22 mmol/L (ref 22–32)
Calcium: 8.6 mg/dL — ABNORMAL LOW (ref 8.9–10.3)
Chloride: 105 mmol/L (ref 98–111)
Creatinine, Ser: 0.86 mg/dL (ref 0.61–1.24)
GFR calc Af Amer: >60 mL/min (ref >60)
GFR calc non Af Amer: >60 mL/min (ref >60)
Glucose, Bld: 147 mg/dL — ABNORMAL HIGH (ref 70–99)
Potassium: 4.2 mmol/L (ref 3.5–5.1)
Sodium: 140 mmol/L (ref 135–145)

## 2019-03-03 LAB — CBC
HCT: 42.7 % (ref 39.0–52.0)
Hemoglobin: 13.9 g/dL (ref 13.0–17.0)
MCH: 31.5 pg (ref 26.0–34.0)
MCHC: 32.6 g/dL (ref 30.0–36.0)
MCV: 96.8 fL (ref 80.0–100.0)
Platelets: 182 10*3/uL (ref 150–400)
RBC: 4.41 MIL/uL (ref 4.22–5.81)
RDW: 13.2 % (ref 11.5–15.5)
WBC: 10 10*3/uL (ref 4.0–10.5)
nRBC: 0 % (ref 0.0–0.2)

## 2019-03-03 LAB — RESPIRATORY PANEL BY RT PCR (FLU A&B, COVID)
Influenza A by PCR: NEGATIVE
Influenza B by PCR: NEGATIVE
SARS Coronavirus 2 by RT PCR: NEGATIVE

## 2019-03-03 LAB — CBG MONITORING, ED: Glucose-Capillary: 127 mg/dL — ABNORMAL HIGH (ref 70–99)

## 2019-03-03 LAB — TYPE AND SCREEN
ABO/RH(D): O POS
Antibody Screen: NEGATIVE

## 2019-03-03 LAB — ABO/RH: ABO/RH(D): O POS

## 2019-03-03 LAB — SURGICAL PCR SCREEN
MRSA, PCR: NEGATIVE
Staphylococcus aureus: POSITIVE — AB

## 2019-03-03 LAB — PROTIME-INR
INR: 1.2 (ref 0.8–1.2)
Prothrombin Time: 15.1 seconds (ref 11.4–15.2)

## 2019-03-03 MED ORDER — ALBUTEROL SULFATE (2.5 MG/3ML) 0.083% IN NEBU: 2.5000 mg | INHALATION_SOLUTION | RESPIRATORY_TRACT | Status: DC | PRN

## 2019-03-03 MED ORDER — FENTANYL CITRATE (PF) 100 MCG/2ML IJ SOLN
50.0000 ug | Freq: Once | INTRAMUSCULAR | Status: AC
  Administered 2019-03-03: 08:00:00 50 ug via INTRAVENOUS
  Filled 2019-03-03 (×2): qty 2

## 2019-03-03 MED ORDER — ONDANSETRON HCL 4 MG/2ML IJ SOLN
4.0000 mg | Freq: Four times a day (QID) | INTRAMUSCULAR | Status: DC | PRN
  Administered 2019-03-03: 12:00:00 4 mg via INTRAVENOUS
  Filled 2019-03-03: qty 2

## 2019-03-03 MED ORDER — ACETAMINOPHEN 500 MG PO TABS
1000.0000 mg | ORAL_TABLET | Freq: Once | ORAL | Status: AC
  Administered 2019-03-03: 07:00:00 1000 mg via ORAL
  Filled 2019-03-03: qty 2

## 2019-03-03 MED ORDER — ONDANSETRON HCL 4 MG/2ML IJ SOLN
4.0000 mg | Freq: Once | INTRAMUSCULAR | Status: AC
  Administered 2019-03-03: 4 mg via INTRAVENOUS
  Filled 2019-03-03 (×2): qty 2

## 2019-03-03 MED ORDER — ONDANSETRON HCL 4 MG/2ML IJ SOLN: 4.0000 mg | Freq: Four times a day (QID) | INTRAMUSCULAR | Status: DC | PRN

## 2019-03-03 MED ORDER — MUPIROCIN 2 % EX OINT
1.0000 "application " | TOPICAL_OINTMENT | Freq: Two times a day (BID) | CUTANEOUS | Status: DC
  Administered 2019-03-03 – 2019-03-04 (×2): 1 via NASAL
  Filled 2019-03-03: qty 22

## 2019-03-03 MED ORDER — PHENYTOIN 50 MG PO CHEW
50.0000 mg | CHEWABLE_TABLET | Freq: Every day | ORAL | Status: DC
  Administered 2019-03-03 – 2019-03-04 (×2): 50 mg via ORAL
  Filled 2019-03-03 (×3): qty 1

## 2019-03-03 MED ORDER — PHENYTOIN SODIUM EXTENDED 100 MG PO CAPS: 100.0000 mg | ORAL_CAPSULE | Freq: Every day | ORAL | Status: DC

## 2019-03-03 MED ORDER — PANTOPRAZOLE SODIUM 20 MG PO TBEC
20.0000 mg | DELAYED_RELEASE_TABLET | Freq: Every day | ORAL | Status: DC
  Administered 2019-03-03: 20 mg via ORAL
  Filled 2019-03-03 (×2): qty 1

## 2019-03-03 MED ORDER — ENOXAPARIN SODIUM 40 MG/0.4ML ~~LOC~~ SOLN
40.0000 mg | SUBCUTANEOUS | Status: AC
  Administered 2019-03-03: 11:00:00 40 mg via SUBCUTANEOUS
  Filled 2019-03-03: qty 0.4

## 2019-03-03 MED ORDER — ONDANSETRON HCL 4 MG PO TABS: 4.0000 mg | ORAL_TABLET | Freq: Four times a day (QID) | ORAL | Status: DC | PRN

## 2019-03-03 MED ORDER — ACETAMINOPHEN 325 MG PO TABS
650.0000 mg | ORAL_TABLET | Freq: Four times a day (QID) | ORAL | Status: DC | PRN
  Administered 2019-03-03: 21:00:00 650 mg via ORAL
  Filled 2019-03-03: qty 2

## 2019-03-03 MED ORDER — FENTANYL CITRATE (PF) 100 MCG/2ML IJ SOLN: 50.0000 ug | INTRAMUSCULAR | Status: DC | PRN

## 2019-03-03 MED ORDER — ACETAMINOPHEN 650 MG RE SUPP: 650.0000 mg | Freq: Four times a day (QID) | RECTAL | Status: DC | PRN

## 2019-03-03 MED ORDER — DULOXETINE HCL 30 MG PO CPEP
30.0000 mg | ORAL_CAPSULE | Freq: Every day | ORAL | Status: DC
  Administered 2019-03-03 – 2019-03-04 (×2): 30 mg via ORAL
  Filled 2019-03-03 (×2): qty 1

## 2019-03-03 MED ORDER — POLYETHYLENE GLYCOL 3350 17 G PO PACK
17.0000 g | PACK | Freq: Every day | ORAL | Status: DC
  Administered 2019-03-03: 17 g via ORAL
  Filled 2019-03-03: qty 1

## 2019-03-03 MED ORDER — PHENYTOIN SODIUM EXTENDED 100 MG PO CAPS
100.0000 mg | ORAL_CAPSULE | Freq: Every day | ORAL | Status: DC
  Administered 2019-03-03 – 2019-03-04 (×2): 100 mg via ORAL
  Filled 2019-03-03 (×2): qty 1

## 2019-03-03 MED ORDER — PHENYTOIN SODIUM EXTENDED 30 MG PO CAPS: 150.0000 mg | ORAL_CAPSULE | Freq: Every day | ORAL | Status: DC

## 2019-03-03 MED ORDER — ALBUTEROL SULFATE HFA 108 (90 BASE) MCG/ACT IN AERS: 1.0000 | INHALATION_SPRAY | RESPIRATORY_TRACT | Status: DC | PRN

## 2019-03-03 MED ORDER — CLONAZEPAM 0.5 MG PO TABS: 0.5000 mg | ORAL_TABLET | Freq: Every day | ORAL | Status: DC

## 2019-03-03 MED ORDER — LEVETIRACETAM 500 MG PO TABS
500.0000 mg | ORAL_TABLET | Freq: Two times a day (BID) | ORAL | Status: DC
  Administered 2019-03-03 – 2019-03-05 (×5): 500 mg via ORAL
  Filled 2019-03-03 (×5): qty 1

## 2019-03-03 MED ORDER — OXYCODONE HCL 5 MG PO TABS
5.0000 mg | ORAL_TABLET | ORAL | Status: DC | PRN
  Administered 2019-03-03: 11:00:00 5 mg via ORAL
  Filled 2019-03-03: qty 1

## 2019-03-03 MED ORDER — SENNOSIDES-DOCUSATE SODIUM 8.6-50 MG PO TABS
1.0000 | ORAL_TABLET | Freq: Two times a day (BID) | ORAL | Status: DC
  Administered 2019-03-03 (×2): 1 via ORAL
  Filled 2019-03-03 (×2): qty 1

## 2019-03-03 MED ORDER — ENOXAPARIN SODIUM 40 MG/0.4ML ~~LOC~~ SOLN: 40.0000 mg | SUBCUTANEOUS | Status: DC

## 2019-03-03 MED ORDER — CHLORHEXIDINE GLUCONATE CLOTH 2 % EX PADS
6.0000 | MEDICATED_PAD | Freq: Every day | CUTANEOUS | Status: DC
  Administered 2019-03-03 – 2019-03-04 (×2): 6 via TOPICAL

## 2019-03-03 NOTE — ED Notes (Signed)
Patient transported to X-Ray 

## 2019-03-03 NOTE — ED Triage Notes (Signed)
Pt comes from home via [facility], pt hit his L hip, did not hit his head, no LOC, pt did have vomiting x 1. Pt was covid + one month ago

## 2019-03-03 NOTE — Consult Note (Signed)
Reason for Consult:Left hip fx Referring Physician: Ruel Favors   Mario Waters is an 79 y.o. male.  HPI: Mario Waters was at home and lost his balance. He fell and struck his left hip on a coffee table. He had immediate pain but was able to get up and ambulate with the help of his wife's walker. He came to the ED for evaluation and x-rays showed a femoral neck fx and orthopedic surgery was consulted. He c/o localized pain to the area of the fx.   Past Medical History:  Diagnosis Date  . Neuropathy   . Seizure disorder (HCC)    since 30s. taking Dilantin 200mg  nightly  . Seizures (HCC)     Past Surgical History:  Procedure Laterality Date  . CATARACT EXTRACTION, BILATERAL    . KNEE SURGERY    . PENECTOMY    . REPAIR OF PERFORATED ULCER    . SPINE SURGERY     Cervical laminectomy  . TOE SURGERY      Family History  Problem Relation Age of Onset  . Cancer Sister        breast  . Cancer Sister   . Seizures Neg Hx     Social History:  reports that he quit smoking about 41 years ago. He has never used smokeless tobacco. He reports that he does not drink alcohol or use drugs.  Allergies: No Known Allergies  Medications: I have reviewed the patient's current medications.  Results for orders placed or performed during the hospital encounter of 03/03/19 (from the past 48 hour(s))  CBC     Status: None   Collection Time: 03/03/19  5:57 AM  Result Value Ref Range   WBC 10.0 4.0 - 10.5 K/uL   RBC 4.41 4.22 - 5.81 MIL/uL   Hemoglobin 13.9 13.0 - 17.0 g/dL   HCT 05/01/19 69.6 - 78.9 %   MCV 96.8 80.0 - 100.0 fL   MCH 31.5 26.0 - 34.0 pg   MCHC 32.6 30.0 - 36.0 g/dL   RDW 38.1 01.7 - 51.0 %   Platelets 182 150 - 400 K/uL   nRBC 0.0 0.0 - 0.2 %    Comment: Performed at [facility], [address], [place], [place]  Basic metabolic panel     Status: Abnormal   Collection Time: 03/03/19  5:57 AM  Result Value Ref Range   Sodium 140 135 - 145 mmol/L   Potassium 4.2  3.5 - 5.1 mmol/L   Chloride 105 98 - 111 mmol/L   CO2 22 22 - 32 mmol/L   Glucose, Bld 147 (H) 70 - 99 mg/dL   BUN 16 8 - 23 mg/dL   Creatinine, Ser 05/01/19 0.61 - 1.24 mg/dL   Calcium 8.6 (L) 8.9 - 10.3 mg/dL   GFR calc non Af Amer >60 >60 mL/min   GFR calc Af Amer >60 >60 mL/min   Anion gap 13 5 - 15    Comment: Performed at [facility], [address], [place], [place]  CBG monitoring, ED     Status: Abnormal   Collection Time: 03/03/19  6:41 AM  Result Value Ref Range   Glucose-Capillary 127 (H) 70 - 99 mg/dL  Protime-INR     Status: None   Collection Time: 03/03/19  6:44 AM  Result Value Ref Range   Prothrombin Time 15.1 11.4 - 15.2 seconds   INR 1.2 0.8 - 1.2    Comment: (NOTE) INR goal varies based  on device and disease states. Performed at [facility], [place] [address], [place], [place] [address]   Type and screen     Status: None (Preliminary result)   Collection Time: 03/03/19  8:11 AM  Result Value Ref Range   ABO/RH(D) PENDING    Antibody Screen PENDING    Sample Expiration      03/06/2019,2359 Performed at [facility], [place] [address], [place], [place] [address]   Respiratory Panel by RT PCR (Flu A&B, Covid) -     Status: None   Collection Time: 03/03/19  8:11 AM  Result Value Ref Range   SARS Coronavirus 2 by RT PCR NEGATIVE NEGATIVE    Comment: (NOTE) SARS-CoV-2 target nucleic acids are NOT DETECTED. The SARS-CoV-2 RNA is generally detectable in upper respiratoy specimens during the acute phase of infection. The lowest concentration of SARS-CoV-2 viral copies this assay can detect is 131 copies/mL. A negative result does not preclude SARS-Cov-2 infection and should not be used as the sole basis for treatment or other patient management decisions. A negative result may occur with  improper specimen collection/handling, submission of specimen other than nasopharyngeal swab, presence of viral mutation(s) within the areas targeted by  this assay, and inadequate number of viral copies (<131 copies/mL). A negative result must be combined with clinical observations, patient history, and epidemiological information. The expected result is Negative. Fact Sheet for Patients:  PinkCheek.be Fact Sheet for Healthcare Providers:  GravelBags.it This test is not yet ap proved or cleared by the [place] FDA and  has been authorized for detection and/or diagnosis of SARS-CoV-2 by FDA under an Emergency Use Authorization (EUA). This EUA will remain  in effect (meaning this test can be used) for the duration of the COVID-19 declaration under Section 564(b)(1) of the Act, 21 U.S.C. section 360bbb-3(b)(1), unless the authorization is terminated or revoked sooner.    Influenza A by PCR NEGATIVE NEGATIVE   Influenza B by PCR NEGATIVE NEGATIVE    Comment: (NOTE) The Xpert Xpress SARS-CoV-2/FLU/RSV assay is intended as an aid in  the diagnosis of influenza from Nasopharyngeal swab specimens and  should not be used as a sole basis for treatment. Nasal washings and  aspirates are unacceptable for Xpert Xpress SARS-CoV-2/FLU/RSV  testing. Fact Sheet for Patients: PinkCheek.be Fact Sheet for Healthcare Providers: GravelBags.it This test is not yet approved or cleared by the [place] FDA and  has been authorized for detection and/or diagnosis of SARS-CoV-2 by  FDA under an Emergency Use Authorization (EUA). This EUA will remain  in effect (meaning this test can be used) for the duration of the  Covid-19 declaration under Section 564(b)(1) of the Act, 21  U.S.C. section 360bbb-3(b)(1), unless the authorization is  terminated or revoked. Performed at [facility], [place] [address], [place], [place] [address]     CT Head Wo Contrast  Result Date: 03/03/2019 CLINICAL DATA:  Fall 3 hours ago.  Episode of  vomiting EXAM: CT HEAD WITHOUT CONTRAST TECHNIQUE: Contiguous axial images were obtained from the base of the skull through the vertex without intravenous contrast. COMPARISON:  11/06/2016 FINDINGS: Brain: No evidence of acute infarction, hemorrhage, hydrocephalus, extra-axial collection or mass lesion/mass effect. Cerebral volume loss in keeping with age. Mild small vessel ischemic type change. Vascular: No hyperdense vessel or unexpected calcification. Skull: Normal. Negative for fracture or focal lesion. Sinuses/Orbits: No acute finding. IMPRESSION: No evidence of intracranial injury.  Unremarkable for age. Electronically Signed   By:  Marnee Spring M.D.   On: 03/03/2019 07:00   CT Hip Left Wo Contrast  Result Date: 03/03/2019 CLINICAL DATA:  Left hip pain after falling. Probable nondisplaced left femoral neck fracture on radiographs. EXAM: CT OF THE LEFT HIP WITHOUT CONTRAST TECHNIQUE: Multidetector CT imaging of the left hip was performed according to the standard protocol. Multiplanar CT image reconstructions were also generated. COMPARISON:  Radiographs same date FINDINGS: Bones/Joint/Cartilage There is a nondisplaced fracture of the mid left femoral neck. This is best seen on the coronal and sagittal images. There is no dislocation or significant hip arthropathy. There is no evidence of fracture of the left hemipelvis. The bones appear adequately mineralized. There is degenerative disc disease at L5-S1. Ligaments Suboptimally assessed by CT. Muscles and Tendons Unremarkable. Soft tissues No significant periarticular hematoma. Mild iliofemoral atherosclerosis noted. IMPRESSION: 1. CT confirms the presence of a nondisplaced fracture of the mid left femoral neck. 2. No pelvic fracture or dislocation. No significant left hip arthropathy for age. Electronically Signed   By: Carey Bullocks M.D.   On: 03/03/2019 08:01   DG Chest Portable 1 View  Result Date: 03/03/2019 CLINICAL DATA:  Preop for hip  injury.  Fall today EXAM: PORTABLE CHEST 1 VIEW COMPARISON:  None. FINDINGS: Normal heart size and mediastinal contours. No acute infiltrate or edema. No effusion or pneumothorax. No acute osseous findings. IMPRESSION: Negative chest. Electronically Signed   By: Marnee Spring M.D.   On: 03/03/2019 07:34   DG Hip Unilat With Pelvis 2-3 Views Left  Result Date: 03/03/2019 CLINICAL DATA:  Fall with left hip pain EXAM: DG HIP (WITH OR WITHOUT PELVIS) 2-3V LEFT COMPARISON:  None. FINDINGS: Probable nondisplaced left femoral neck fracture, but recommend CT or MRI to confirm. No dislocation or notable acetabular spurring. No evidence of pelvic ring fracture or diastasis. IMPRESSION: Very likely nondisplaced left femoral neck fracture but recommend confirmation with CT or MRI. Electronically Signed   By: Marnee Spring M.D.   On: 03/03/2019 06:39    Review of Systems  HENT: Negative for ear discharge, ear pain, hearing loss and tinnitus.   Eyes: Negative for photophobia and pain.  Respiratory: Negative for cough and shortness of breath.   Cardiovascular: Negative for chest pain.  Gastrointestinal: Negative for abdominal pain, nausea and vomiting.  Genitourinary: Negative for dysuria, flank pain, frequency and urgency.  Musculoskeletal: Positive for arthralgias (Left hip). Negative for back pain, myalgias and neck pain.  Neurological: Negative for dizziness and headaches.  Hematological: Does not bruise/bleed easily.  Psychiatric/Behavioral: The patient is not nervous/anxious.    Blood pressure 140/76, pulse 98, temperature 98.7 F (37.1 C), temperature source Oral, resp. rate 17, SpO2 96 %. Physical Exam  Constitutional: He appears well-developed and well-nourished. No distress.  HENT:  Head: Normocephalic and atraumatic.  Eyes: Conjunctivae are normal. Right eye exhibits no discharge. Left eye exhibits no discharge. No scleral icterus.  Cardiovascular: Normal rate and regular rhythm.   Respiratory: Effort normal. No respiratory distress.  Musculoskeletal:     Cervical back: Normal range of motion.     Comments: LLE No traumatic wounds, ecchymosis, or rash  Mild TTP hip  No knee or ankle effusion  Knee stable to varus/ valgus and anterior/posterior stress  Sens DPN, SPN, TN intact  Motor EHL, ext, flex, evers 5/5  DP 2+, PT 0, No significant edema  Neurological: He is alert.  Skin: Skin is warm and dry. He is not diaphoretic.  Psychiatric: He has a normal mood  and affect. His behavior is normal.    Assessment/Plan: Left hip fx -- Plan cannulated hip pinning tomorrow with either Dr. Eulah Pont or Dr. Dion Saucier. Please keep NPO after MN. Seizure d/o    Freeman Caldron, PA-C Orthopedic Surgery [phone] 03/03/2019, 9:30 AM

## 2019-03-03 NOTE — ED Provider Notes (Signed)
  Physical Exam  BP 140/76   Pulse (!) 102   Temp 98.7 F (37.1 C) (Oral)   Resp 17   SpO2 95%   Physical Exam  ED Course/Procedures     Procedures  MDM  Patient with fall.  Became lightheaded and fell.  Reportedly has a left hip fracture.  Lab work otherwise reassuring.  Depakote level pending.  Will admit to unassigned medicine with Ortho consult.  Have discussed with Earney Hamburg from orthopedic surgery.  Patient has not eaten today.       Benjiman Core, MD 03/03/19 [phone]

## 2019-03-03 NOTE — H&P (Addendum)
Date: 03/03/2019               Patient Name:  Mario Waters MRN: [id number]  DOB: 12/07/1940 Age / Sex: 79 y.o., male   PCP: Berkley Harvey, NP         Medical Service: Internal Medicine Teaching Service         Attending Physician: Dr. Evette Doffing, Mallie Mussel, *    First Contact: Dr. Benjamine Mola Pager: [phone]  Second Contact: Dr. Maricela Bo Pager: [phone]       After Hours (After 5p/  First Contact Pager: [phone]  weekends / holidays): Second Contact Pager: [phone]   Chief Complaint: Fall  History of Present Illness: Mario Waters is a 79 y.o male with anxiety/depression, a known seizure disorder, and insomnia who presented to the emergency department with left hip pain after a fall. History was obtained via the patient and through chart review.  Patient states that he was in his normal state of health until yesterday evening. While walking to the dinner table he experienced an episode of diaphoresis and nausea/vomiting. He subsequently recovered and went to the table to eat dinner. Shortly after finishing dinner he experienced another episode of nonbloody emesis. He then stood up to go back into the kitchen, felt lightheaded and subsequently fell striking his left hip on the table. He did not lose consciousness or hit his head. His pain persisted and this is what prompted him to come to the emergency department for further evaluation.  Prior to last night's episodes he had been feeling well. He denies fevers/chills, sinus congestion, headaches, visual changes, sore throat, shortness of breath, cough, chest pain, palpitations, abdominal pain, constipation/diarrhea, dysuria, polyuria, urinary hesitancy, rash, myalgias, arthralgias. He lives at home with his wife and to the best of his knowledge is not been around anyone that has been sick. He notes that his last fall was approximately three years ago and occurred while he was moving the ramp. He has not had any recent seizures. He has not  had any recent changes to his medications.  Meds:  No current facility-administered medications on file prior to encounter.   Current Outpatient Medications on File Prior to Encounter  Medication Sig Dispense Refill  . albuterol (PROVENTIL HFA;VENTOLIN HFA) 108 (90 Base) MCG/ACT inhaler Inhale into the lungs.    . Cholecalciferol (VITAMIN D) 2000 units CAPS Take 2,000 Units by mouth daily.    . clonazePAM (KLONOPIN) 0.5 MG tablet Take 1 tablet (0.5 mg total) by mouth at bedtime. 30 tablet 0  . DULoxetine (CYMBALTA) 30 MG capsule Take 1 capsule (30 mg total) by mouth at bedtime. 30 capsule 6  . ibuprofen (ADVIL,MOTRIN) 600 MG tablet Take 1 tablet (600 mg total) by mouth every 6 (six) hours as needed. 30 tablet 0  . levETIRAcetam (KEPPRA) 500 MG tablet Take 1 tablet (500 mg total) by mouth 2 (two) times daily. 180 tablet 3  . Multiple Vitamins-Minerals (PRESERVISION AREDS 2) CAPS Take 2 capsules by mouth.    . Omega-3 Fatty Acids (OMEGA-3 FISH OIL) 300 MG CAPS Take by mouth daily.    . phenytoin (DILANTIN) 100 MG ER capsule Take 1 capsule (100 mg total) by mouth at bedtime. 90 capsule 3  . PHENYTOIN INFATABS 50 MG tablet CHEW AND SWALLOW 1 TABLET BY MOUTH AT BEDTIME 90 tablet 4   Allergies: Allergies as of 03/03/2019  . (No Known Allergies)   Past Medical History:  Diagnosis Date  . Neuropathy   .  Seizure disorder (HCC)    since 94s. taking Dilantin 200mg  nightly  . Seizures (HCC)    Family History  Problem Relation Age of Onset  . Cancer Sister        breast  . Cancer Sister   . Seizures Neg Hx    Social History: Patient is currently married. He lives at home with his wife and is functional in all of his ADLs. He has four grown children. All of whom live in . He previously worked in [place] before retiring. He denies the use of tobacco products, alcohol, or illicit substances.  Review of Systems: A complete ROS was negative except as per HPI.  Physical  Exam: Blood pressure 140/76, pulse 98, temperature 98.7 F (37.1 C), temperature source Oral, resp. rate 17, SpO2 96 %.  General: Thin elderly male in no acute distress HENT: Normocephalic, atraumatic, moist mucus membranes Pulm: Good air movement with no wheezing or crackles  CV: RRR, no murmurs, no rubs  Abdomen: Active bowel sounds, soft, non-distended, no tenderness to palpation  Extremities: Pulses palpable in all extremities, no LE edema  Skin: Warm and dry  Neuro: Alert and oriented x 3  EKG: personally reviewed my interpretation is sinus tachycardia with normal axis. First-degree AV block. No other acute changes compared to prior.  CT Head without Contrast  No evidence of intracranial injury.  Unremarkable for age.  CT Left Hip without Contrast 1. CT confirms the presence of a nondisplaced fracture of the mid left femoral neck. 2. No pelvic fracture or dislocation. No significant left hip arthropathy for age.  Assessment & Plan by Problem: Active Problems:   Hip fracture (HCC)  Mario Waters is a 79 y.o male with anxiety/depression, a known seizure disorder, and insomnia who presented to the emergency department with left hip pain after a fall. He was subsequently found to have a nondisplaced fracture of the mid left femoral neck. Orthopedic surgery was consulted and he was admitted for further evaluation/management.   Nondisplaced fracture of the mid left femoral neck 2/2 fall  - Patient is hemodynamically stable. It is likely that his fall was a result of pre-syncope. The etiology of his pre-syncope is likely vasovagal versus situational given his history. We are unable to obtain orthostatics due to his acute left hip fracture. -Patient has by definition osteoporosis given his low impact fracture. Would benefit from bisphosphonate therapy in the future. - Pain control with PRN Tylenol, oxycodone 5 mg every four hours PRN, and fentanyl 50 mcg every two hours PRN - Start  bowel regimen. MiraLAX 17 g daily and Senokot S 1 tablet BID - Zofran for nausea - PT/OT consult - Appreciate orthopedic consultation and recommendations in regards to the patient's left hip fracture.  Seizure Disorder  - Dilantin level pending  - Continue Keppra 500 mg BID and Dilantin ER 150 mg QHS  Anxiety / MDD - Continue Duloxetine DR 30 mg QD - Continue Klonopin 0.5 mg QHS  Diet: NPO until ortho eval  VTE ppx: Lovenox (starting tomorrow) CODE STATUS: Full Code  Dispo: Admit patient to Inpatient with expected length of stay greater than 2 midnights.  Signed96, MD 03/03/2019, 9:27 AM  Pager: [phone]

## 2019-03-03 NOTE — ED Provider Notes (Signed)
CHIEF COMPLAINT: Fall, dizziness, vomiting  HPI: Patient is an 79 year old male with history of seizures on Dilantin who presents to the emergency department after he had a fall approximately 3 hours ago.  Patient comes in with [facility].  Reports that he felt lightheaded which caused him to fall hitting his left hip on the corner of a table and then falling to the ground.  He does not think that he hit his head.  He is not on any blood thinners.  He denies neck or back pain.  No chest or abdominal pain.  States he was able to get up off the floor but had to use his wife's walker to ambulate.  Complaining of left hip pain.  Did have one episode of vomiting after his fall.  No fevers, cough, diarrhea recently.  Patient does not have a local orthopedist.  PCP is Fatima Sanger.  ROS: See HPI Constitutional: no fever  Eyes: no drainage  ENT: no runny nose   Cardiovascular:  no chest pain  Resp: no SOB  GI: no vomiting GU: no dysuria Integumentary: no rash  Allergy: no hives  Musculoskeletal: no leg swelling  Neurological: no slurred speech ROS otherwise negative  PAST MEDICAL HISTORY/PAST SURGICAL HISTORY:  Past Medical History:  Diagnosis Date  . Neuropathy   . Seizure disorder (HCC)    since 41s. taking Dilantin 200mg  nightly  . Seizures (HCC)     MEDICATIONS:  Prior to Admission medications   Medication Sig Start Date End Date Taking? Authorizing Provider  albuterol (PROVENTIL HFA;VENTOLIN HFA) 108 (90 Base) MCG/ACT inhaler Inhale into the lungs. 04/27/16   [provider]  Cholecalciferol (VITAMIN D) 2000 units CAPS Take 2,000 Units by mouth daily.    [provider]  clonazePAM (KLONOPIN) 0.5 MG tablet Take 1 tablet (0.5 mg total) by mouth at bedtime. 10/20/15   Henson, Vickie L, NP-C  DULoxetine (CYMBALTA) 30 MG capsule Take 1 capsule (30 mg total) by mouth at bedtime. 07/13/18   07/15/18, NP  ibuprofen (ADVIL,MOTRIN) 600 MG tablet Take 1 tablet  (600 mg total) by mouth every 6 (six) hours as needed. 08/20/15   08/22/15, MD  levETIRAcetam (KEPPRA) 500 MG tablet Take 1 tablet (500 mg total) by mouth 2 (two) times daily. 07/13/18   07/15/18, NP  Multiple Vitamins-Minerals (PRESERVISION AREDS 2) CAPS Take 2 capsules by mouth.    [provider]  Omega-3 Fatty Acids (OMEGA-3 FISH OIL) 300 MG CAPS Take by mouth daily.    [provider]  phenytoin (DILANTIN) 100 MG ER capsule Take 1 capsule (100 mg total) by mouth at bedtime. 07/13/18   07/15/18, NP  PHENYTOIN INFATABS 50 MG tablet CHEW AND SWALLOW 1 TABLET BY MOUTH AT BEDTIME 08/18/18   08/20/18, NP    ALLERGIES:  No Known Allergies  SOCIAL HISTORY:  Social History   Tobacco Use  . Smoking status: Former Smoker    Quit date: 1980    Years since quitting: 41.1  . Smokeless tobacco: Never Used  Substance Use Topics  . Alcohol use: No    Comment: Quit 1980    FAMILY HISTORY: Family History  Problem Relation Age of Onset  . Cancer Sister        breast  . Cancer Sister   . Seizures Neg Hx     EXAM: BP (!) 147/92 (BP Location: Right Arm)   Pulse 100   Temp 98.7 F (37.1  C) (Oral)   Resp 19   SpO2 95%  CONSTITUTIONAL: Alert and oriented and responds appropriately to questions.  Elderly, thin, in no significant distress. HEAD: Normocephalic; atraumatic EYES: Conjunctivae clear, PERRL, EOMI ENT: normal nose; no rhinorrhea; moist mucous membranes; pharynx without lesions noted; no dental injury; no septal hematoma NECK: Supple, no meningismus, no LAD; no midline spinal tenderness, step-off or deformity; trachea midline CARD: RRR; S1 and S2 appreciated; no murmurs, no clicks, no rubs, no gallops RESP: Normal chest excursion without splinting or tachypnea; breath sounds clear and equal bilaterally; no wheezes, no rhonchi, no rales; no hypoxia or respiratory distress CHEST:  chest wall stable, no crepitus or ecchymosis or deformity,  nontender to palpation; no flail chest ABD/GI: Normal bowel sounds; non-distended; soft, non-tender, no rebound, no guarding; no ecchymosis or other lesions noted PELVIS:  stable, tender over the left hip without leg length discrepancy, has a hard time lifting the left leg off of the bed due to pain BACK:  The back appears normal and is non-tender to palpation, there is no CVA tenderness; no midline spinal tenderness, step-off or deformity EXT: Other than left hip tenderness to the lateral left hip, patient has no bony deformity or bony tenderness on exam.  His compartments are all soft.  Extremities are warm and well perfused.  2+ DP pulses bilaterally. SKIN: Normal color for age and race; warm NEURO: Moves all extremities equally, normal speech, reports normal sensation diffusely PSYCH: The patient's mood and manner are appropriate. Grooming and personal hygiene are appropriate.  MEDICAL DECISION MAKING: Patient here with fall.  Reports feeling dizzy prior to his fall.  No preceding chest pain or shortness of breath.  Did have one episode of vomiting after his fall but does not think he hit his head.  He is not on blood thinners.  No current neurologic deficits.  Denies any recent infectious symptoms.  Will check labs, urine and obtain CT of his head and x-ray of the left hip.  Will provide with Tylenol for pain.  ED PROGRESS: X-ray of the left hip concerning for nondisplaced femoral neck fracture.  Recommended obtaining CT scan.  Discussed these findings with patient.  He reports he does not have a local orthopedist.  He normally ambulates at home without assistance.  Tylenol did not provide any relief of pain.  Will give fentanyl, Zofran.  Labs, CT hip, chest x-ray.  Signed out to Dr. Rubin Payor.  Patient updated with plan.   I reviewed all nursing notes and pertinent previous records as available.  I have interpreted any EKGs, lab and urine results, imaging (as available).    Date: 03/03/2019  6:33 AM  Rate: 106  Rhythm: Sinus tachycardia  QRS Axis: normal  Intervals: normal  ST/T Wave abnormalities: Nonspecific T wave abnormalities in lateral leads  Conduction Disutrbances: none  Narrative Interpretation: Sinus tachycardia, nonspecific T wave abnormalities in lateral leads        Mario Waters was evaluated in Emergency Department on 03/03/2019 for the symptoms described in the history of present illness. He was evaluated in the context of the global COVID-19 pandemic, which necessitated consideration that the patient might be at risk for infection with the SARS-CoV-2 virus that causes COVID-19. Institutional protocols and algorithms that pertain to the evaluation of patients at risk for COVID-19 are in a state of rapid change based on information released by regulatory bodies including the CDC and federal and state organizations. These policies and algorithms were followed during the patient's  care in the ED.  Patient was seen wearing N95, face shield, gloves.     Graceson Nichelson, Delice Bison, DO 03/03/19 [phone]

## 2019-03-03 NOTE — Plan of Care (Signed)
  Problem: Education: Goal: Knowledge of General Education information will improve Description: Including pain rating scale, medication(s)/side effects and non-pharmacologic comfort measures Outcome: Progressing   Problem: Clinical Measurements: Goal: Will remain free from infection Outcome: Progressing   Problem: Activity: Goal: Risk for activity intolerance will decrease Outcome: Progressing   Problem: Pain Managment: Goal: General experience of comfort will improve Outcome: Progressing   Problem: Safety: Goal: Ability to remain free from injury will improve Outcome: Progressing   Problem: Skin Integrity: Goal: Risk for impaired skin integrity will decrease Outcome: Progressing   Problem: Education: Goal: Verbalization of understanding the information provided (i.e., activity precautions, restrictions, etc) will improve Outcome: Progressing   Problem: Activity: Goal: Ability to ambulate and perform ADLs will improve Outcome: Progressing   Problem: Self-Concept: Goal: Ability to maintain and perform role responsibilities to the fullest extent possible will improve Outcome: Progressing   Problem: Pain Management: Goal: Pain level will decrease Outcome: Progressing   Problem: Education: Goal: Verbalization of understanding the information provided (i.e., activity precautions, restrictions, etc) will improve Outcome: Progressing   Problem: Activity: Goal: Ability to ambulate and perform ADLs will improve Outcome: Progressing

## 2019-03-04 ENCOUNTER — Inpatient Hospital Stay (HOSPITAL_COMMUNITY): Payer: Medicare PPO | Admitting: Certified Registered Nurse Anesthetist

## 2019-03-04 ENCOUNTER — Encounter (HOSPITAL_COMMUNITY)
Admission: EM | Disposition: A | Discharge status: Home Health | Payer: Self-pay | Source: Home / Self Care | Attending: Student in an Organized Health Care Education/Training Program

## 2019-03-04 ENCOUNTER — Inpatient Hospital Stay (HOSPITAL_COMMUNITY): Payer: Medicare PPO

## 2019-03-04 ENCOUNTER — Encounter (HOSPITAL_COMMUNITY): Payer: Self-pay | Admitting: Student in an Organized Health Care Education/Training Program

## 2019-03-04 ENCOUNTER — Other Ambulatory Visit: Payer: Self-pay

## 2019-03-04 DIAGNOSIS — F329 Major depressive disorder, single episode, unspecified: Secondary | ICD-10-CM

## 2019-03-04 DIAGNOSIS — S72002A Fracture of unspecified part of neck of left femur, initial encounter for closed fracture: Principal | ICD-10-CM

## 2019-03-04 DIAGNOSIS — M810 Age-related osteoporosis without current pathological fracture: Secondary | ICD-10-CM

## 2019-03-04 DIAGNOSIS — W19XXXA Unspecified fall, initial encounter: Secondary | ICD-10-CM

## 2019-03-04 DIAGNOSIS — F419 Anxiety disorder, unspecified: Secondary | ICD-10-CM

## 2019-03-04 DIAGNOSIS — G40909 Epilepsy, unspecified, not intractable, without status epilepticus: Secondary | ICD-10-CM

## 2019-03-04 DIAGNOSIS — Z79899 Other long term (current) drug therapy: Secondary | ICD-10-CM

## 2019-03-04 DIAGNOSIS — G4700 Insomnia, unspecified: Secondary | ICD-10-CM

## 2019-03-04 HISTORY — PX: HIP PINNING,CANNULATED: SHX1758

## 2019-03-04 LAB — CBC
HCT: 38.1 % — ABNORMAL LOW (ref 39.0–52.0)
Hemoglobin: 13 g/dL (ref 13.0–17.0)
MCH: 32.3 pg (ref 26.0–34.0)
MCHC: 34.1 g/dL (ref 30.0–36.0)
MCV: 94.5 fL (ref 80.0–100.0)
Platelets: 164 10*3/uL (ref 150–400)
RBC: 4.03 MIL/uL — ABNORMAL LOW (ref 4.22–5.81)
RDW: 13 % (ref 11.5–15.5)
WBC: 7.4 10*3/uL (ref 4.0–10.5)
nRBC: 0 % (ref 0.0–0.2)

## 2019-03-04 LAB — BASIC METABOLIC PANEL
Anion gap: 7 (ref 5–15)
BUN: 10 mg/dL (ref 8–23)
CO2: 27 mmol/L (ref 22–32)
Calcium: 7.9 mg/dL — ABNORMAL LOW (ref 8.9–10.3)
Chloride: 100 mmol/L (ref 98–111)
Creatinine, Ser: 0.86 mg/dL (ref 0.61–1.24)
GFR calc Af Amer: >60 mL/min (ref >60)
GFR calc non Af Amer: >60 mL/min (ref >60)
Glucose, Bld: 133 mg/dL — ABNORMAL HIGH (ref 70–99)
Potassium: 3.4 mmol/L — ABNORMAL LOW (ref 3.5–5.1)
Sodium: 134 mmol/L — ABNORMAL LOW (ref 135–145)

## 2019-03-04 LAB — PHENYTOIN LEVEL, FREE AND TOTAL
Phenytoin, Free: NOT DETECTED ug/mL (ref 1.0–2.0)
Phenytoin, Total: 3.4 ug/mL — ABNORMAL LOW (ref 10.0–20.0)

## 2019-03-04 SURGERY — FIXATION, FEMUR, NECK, PERCUTANEOUS, USING SCREW
Anesthesia: Monitor Anesthesia Care | Site: Hip | Laterality: Left

## 2019-03-04 MED ORDER — HYDROCODONE-ACETAMINOPHEN 7.5-325 MG PO TABS
1.0000 | ORAL_TABLET | ORAL | Status: DC | PRN
  Administered 2019-03-04: 1 via ORAL
  Filled 2019-03-04: qty 1
  Filled 2019-03-04: qty 2

## 2019-03-04 MED ORDER — MENTHOL 3 MG MT LOZG: 1.0000 | LOZENGE | OROMUCOSAL | Status: DC | PRN

## 2019-03-04 MED ORDER — CHLORHEXIDINE GLUCONATE 4 % EX LIQD
60.0000 mL | Freq: Once | CUTANEOUS | Status: AC
  Administered 2019-03-04: 4 via TOPICAL

## 2019-03-04 MED ORDER — ENOXAPARIN SODIUM 40 MG/0.4ML ~~LOC~~ SOLN: 40.0000 mg | SUBCUTANEOUS | 0 refills | Status: DC

## 2019-03-04 MED ORDER — 0.9 % SODIUM CHLORIDE (POUR BTL) OPTIME
TOPICAL | Status: DC | PRN
  Administered 2019-03-04: 12:00:00 1000 mL

## 2019-03-04 MED ORDER — ENSURE PRE-SURGERY PO LIQD
296.0000 mL | ORAL | Status: DC
  Filled 2019-03-04: qty 296

## 2019-03-04 MED ORDER — POVIDONE-IODINE 10 % EX SWAB: 2.0000 "application " | Freq: Once | CUTANEOUS | Status: DC

## 2019-03-04 MED ORDER — PROPOFOL 500 MG/50ML IV EMUL
INTRAVENOUS | Status: DC | PRN
  Administered 2019-03-04: 50 ug/kg/min via INTRAVENOUS

## 2019-03-04 MED ORDER — SOD CITRATE-CITRIC ACID 500-334 MG/5ML PO SOLN
ORAL | Status: DC | PRN
  Administered 2019-03-04: 15 mL via ORAL

## 2019-03-04 MED ORDER — ONDANSETRON HCL 4 MG/2ML IJ SOLN: 4.0000 mg | Freq: Once | INTRAMUSCULAR | Status: DC | PRN

## 2019-03-04 MED ORDER — PROPOFOL 1000 MG/100ML IV EMUL
INTRAVENOUS | Status: AC
  Filled 2019-03-04: qty 100

## 2019-03-04 MED ORDER — ONDANSETRON HCL 4 MG PO TABS: 4.0000 mg | ORAL_TABLET | Freq: Four times a day (QID) | ORAL | Status: DC | PRN

## 2019-03-04 MED ORDER — ONDANSETRON HCL 4 MG/2ML IJ SOLN: 4.0000 mg | Freq: Four times a day (QID) | INTRAMUSCULAR | Status: DC | PRN

## 2019-03-04 MED ORDER — MORPHINE SULFATE (PF) 2 MG/ML IV SOLN: 0.5000 mg | INTRAVENOUS | Status: DC | PRN

## 2019-03-04 MED ORDER — PANTOPRAZOLE SODIUM 40 MG PO TBEC
40.0000 mg | DELAYED_RELEASE_TABLET | Freq: Every day | ORAL | Status: DC
  Administered 2019-03-04 – 2019-03-05 (×2): 40 mg via ORAL
  Filled 2019-03-04 (×2): qty 1

## 2019-03-04 MED ORDER — PHENYLEPHRINE HCL-NACL 10-0.9 MG/250ML-% IV SOLN
INTRAVENOUS | Status: DC | PRN
  Administered 2019-03-04: 25 ug/min via INTRAVENOUS

## 2019-03-04 MED ORDER — ACETAMINOPHEN 325 MG PO TABS: 325.0000 mg | ORAL_TABLET | Freq: Four times a day (QID) | ORAL | Status: DC | PRN

## 2019-03-04 MED ORDER — LIDOCAINE 2% (20 MG/ML) 5 ML SYRINGE
INTRAMUSCULAR | Status: AC
  Filled 2019-03-04: qty 5

## 2019-03-04 MED ORDER — ALUM & MAG HYDROXIDE-SIMETH 200-200-20 MG/5ML PO SUSP: 30.0000 mL | ORAL | Status: DC | PRN

## 2019-03-04 MED ORDER — ONDANSETRON HCL 4 MG/2ML IJ SOLN
INTRAMUSCULAR | Status: DC | PRN
  Administered 2019-03-04: 4 mg via INTRAVENOUS

## 2019-03-04 MED ORDER — SENNA 8.6 MG PO TABS
1.0000 | ORAL_TABLET | Freq: Two times a day (BID) | ORAL | Status: DC
  Administered 2019-03-04 – 2019-03-05 (×2): 8.6 mg via ORAL
  Filled 2019-03-04 (×2): qty 1

## 2019-03-04 MED ORDER — MAGNESIUM CITRATE PO SOLN: 1.0000 | Freq: Once | ORAL | Status: DC | PRN

## 2019-03-04 MED ORDER — POLYETHYLENE GLYCOL 3350 17 G PO PACK: 17.0000 g | PACK | Freq: Every day | ORAL | Status: DC | PRN

## 2019-03-04 MED ORDER — DOCUSATE SODIUM 100 MG PO CAPS
100.0000 mg | ORAL_CAPSULE | Freq: Two times a day (BID) | ORAL | Status: DC
  Administered 2019-03-04 – 2019-03-05 (×2): 100 mg via ORAL
  Filled 2019-03-04 (×2): qty 1

## 2019-03-04 MED ORDER — POTASSIUM CHLORIDE IN NACL 20-0.45 MEQ/L-% IV SOLN
INTRAVENOUS | Status: DC
  Filled 2019-03-04: qty 1000

## 2019-03-04 MED ORDER — ACETAMINOPHEN 500 MG PO TABS
500.0000 mg | ORAL_TABLET | Freq: Four times a day (QID) | ORAL | Status: AC
  Administered 2019-03-04 – 2019-03-05 (×4): 500 mg via ORAL
  Filled 2019-03-04 (×4): qty 1

## 2019-03-04 MED ORDER — OXYCODONE HCL 5 MG/5ML PO SOLN: 5.0000 mg | Freq: Once | ORAL | Status: DC | PRN

## 2019-03-04 MED ORDER — PHENOL 1.4 % MT LIQD: 1.0000 | OROMUCOSAL | Status: DC | PRN

## 2019-03-04 MED ORDER — HYDROCODONE-ACETAMINOPHEN 5-325 MG PO TABS
1.0000 | ORAL_TABLET | ORAL | Status: DC | PRN
  Administered 2019-03-05 (×2): 2 via ORAL
  Filled 2019-03-04 (×2): qty 2

## 2019-03-04 MED ORDER — LACTATED RINGERS IV SOLN: INTRAVENOUS | Status: DC

## 2019-03-04 MED ORDER — FENTANYL CITRATE (PF) 100 MCG/2ML IJ SOLN: 25.0000 ug | INTRAMUSCULAR | Status: DC | PRN

## 2019-03-04 MED ORDER — ATORVASTATIN CALCIUM 10 MG PO TABS
20.0000 mg | ORAL_TABLET | Freq: Every day | ORAL | Status: DC
  Administered 2019-03-04: 22:00:00 20 mg via ORAL
  Filled 2019-03-04: qty 2

## 2019-03-04 MED ORDER — LIDOCAINE 2% (20 MG/ML) 5 ML SYRINGE
INTRAMUSCULAR | Status: DC | PRN
  Administered 2019-03-04: 40 mg via INTRAVENOUS

## 2019-03-04 MED ORDER — PROPOFOL 10 MG/ML IV BOLUS
INTRAVENOUS | Status: DC | PRN
  Administered 2019-03-04 (×2): 30 mg via INTRAVENOUS

## 2019-03-04 MED ORDER — BISACODYL 10 MG RE SUPP: 10.0000 mg | Freq: Every day | RECTAL | Status: DC | PRN

## 2019-03-04 MED ORDER — PROPOFOL 10 MG/ML IV BOLUS
INTRAVENOUS | Status: AC
  Filled 2019-03-04: qty 20

## 2019-03-04 MED ORDER — OXYCODONE HCL 5 MG PO TABS: 5.0000 mg | ORAL_TABLET | Freq: Once | ORAL | Status: DC | PRN

## 2019-03-04 MED ORDER — FERROUS SULFATE 325 (65 FE) MG PO TABS
325.0000 mg | ORAL_TABLET | Freq: Three times a day (TID) | ORAL | Status: DC
  Administered 2019-03-04 – 2019-03-05 (×3): 325 mg via ORAL
  Filled 2019-03-04 (×3): qty 1

## 2019-03-04 MED ORDER — ENOXAPARIN SODIUM 40 MG/0.4ML ~~LOC~~ SOLN: 40.0000 mg | SUBCUTANEOUS | Status: DC

## 2019-03-04 MED ORDER — CEFAZOLIN SODIUM-DEXTROSE 2-4 GM/100ML-% IV SOLN
2.0000 g | Freq: Four times a day (QID) | INTRAVENOUS | Status: AC
  Administered 2019-03-04 (×2): 2 g via INTRAVENOUS
  Filled 2019-03-04 (×2): qty 100

## 2019-03-04 MED ORDER — CEFAZOLIN SODIUM-DEXTROSE 2-4 GM/100ML-% IV SOLN
2.0000 g | INTRAVENOUS | Status: AC
  Administered 2019-03-04: 13:00:00 2 g via INTRAVENOUS
  Filled 2019-03-04: qty 100

## 2019-03-04 MED ORDER — ENOXAPARIN SODIUM 40 MG/0.4ML ~~LOC~~ SOLN
40.0000 mg | SUBCUTANEOUS | Status: DC
  Administered 2019-03-05: 09:00:00 40 mg via SUBCUTANEOUS
  Filled 2019-03-04: qty 0.4

## 2019-03-04 MED ORDER — FENTANYL CITRATE (PF) 250 MCG/5ML IJ SOLN
INTRAMUSCULAR | Status: AC
  Filled 2019-03-04: qty 5

## 2019-03-04 MED ORDER — ONDANSETRON HCL 4 MG/2ML IJ SOLN
INTRAMUSCULAR | Status: AC
  Filled 2019-03-04: qty 2

## 2019-03-04 MED ORDER — KETOROLAC TROMETHAMINE 15 MG/ML IJ SOLN
7.5000 mg | Freq: Four times a day (QID) | INTRAMUSCULAR | Status: DC
  Administered 2019-03-04 – 2019-03-05 (×3): 7.5 mg via INTRAVENOUS
  Filled 2019-03-04 (×4): qty 1

## 2019-03-04 SURGICAL SUPPLY — 52 items
BENZOIN TINCTURE PRP APPL 2/3 (GAUZE/BANDAGES/DRESSINGS) IMPLANT
BIT DRILL CANNULATED (DRILL) IMPLANT
BNDG COHESIVE 6X5 TAN STRL LF (GAUZE/BANDAGES/DRESSINGS) ×4 IMPLANT
BOOTCOVER CLEANROOM LRG (PROTECTIVE WEAR) ×2 IMPLANT
CLOSURE STERI-STRIP 1/2X4 (GAUZE/BANDAGES/DRESSINGS)
CLOSURE WOUND 1/2 X4 (GAUZE/BANDAGES/DRESSINGS) ×1
CLSR STERI-STRIP ANTIMIC 1/2X4 (GAUZE/BANDAGES/DRESSINGS) IMPLANT
COVER PERINEAL POST (MISCELLANEOUS) ×3 IMPLANT
COVER SURGICAL LIGHT HANDLE (MISCELLANEOUS) ×3 IMPLANT
COVER WAND RF STERILE (DRAPES) ×3 IMPLANT
DRAPE STERI IOBAN 125X83 (DRAPES) ×3 IMPLANT
DRILL CANNULATED (DRILL) ×3
DRSG MEPILEX BORDER 4X4 (GAUZE/BANDAGES/DRESSINGS) ×3 IMPLANT
DRSG PAD ABDOMINAL 8X10 ST (GAUZE/BANDAGES/DRESSINGS) IMPLANT
DURAPREP 26ML APPLICATOR (WOUND CARE) ×3 IMPLANT
ELECT REM PT RETURN 9FT ADLT (ELECTROSURGICAL)
ELECTRODE REM PT RTRN 9FT ADLT (ELECTROSURGICAL) ×1 IMPLANT
GLOVE BIO SURGEON STRL SZ7 (GLOVE) ×2 IMPLANT
GLOVE BIOGEL PI ORTHO PRO SZ8 (GLOVE) ×4
GLOVE ORTHO TXT STRL SZ7.5 (GLOVE) ×3 IMPLANT
GLOVE PI ORTHO PRO STRL SZ8 (GLOVE) ×2 IMPLANT
GLOVE SURG ORTHO 8.0 STRL STRW (GLOVE) ×2 IMPLANT
GLOVE SURG SS PI 7.5 STRL IVOR (GLOVE) ×6 IMPLANT
GOWN STRL REUS W/ TWL LRG LVL3 (GOWN DISPOSABLE) IMPLANT
GOWN STRL REUS W/ TWL XL LVL3 (GOWN DISPOSABLE) ×1 IMPLANT
GOWN STRL REUS W/TWL 2XL LVL3 (GOWN DISPOSABLE) IMPLANT
GOWN STRL REUS W/TWL LRG LVL3 (GOWN DISPOSABLE) ×6
GOWN STRL REUS W/TWL XL LVL3 (GOWN DISPOSABLE)
KIT BASIN OR (CUSTOM PROCEDURE TRAY) ×3 IMPLANT
KIT TURNOVER KIT B (KITS) ×3 IMPLANT
MANIFOLD NEPTUNE II (INSTRUMENTS) ×1 IMPLANT
NEEDLE 22X1 1/2 (OR ONLY) (NEEDLE) ×3 IMPLANT
NS IRRIG 1000ML POUR BTL (IV SOLUTION) ×3 IMPLANT
PACK GENERAL/GYN (CUSTOM PROCEDURE TRAY) ×3 IMPLANT
PAD ARMBOARD 7.5X6 YLW CONV (MISCELLANEOUS) ×2 IMPLANT
PIN GUIDE 3.2MM (PIN) ×4 IMPLANT
PIN THD TIP GUIDE 3.2X12 (PIN) ×2 IMPLANT
SCREW CANN RVR CUT FLUT 90X16X (Screw) IMPLANT
SCREW CANNULATED 6.5X90 (Screw) ×4 IMPLANT
SCREW CANNULATED 6.5X95 (Screw) ×2 IMPLANT
STRIP CLOSURE SKIN 1/2X4 (GAUZE/BANDAGES/DRESSINGS) ×1 IMPLANT
SUT VIC AB 0 CT1 27 (SUTURE) ×2
SUT VIC AB 0 CT1 27XBRD ANTBC (SUTURE) IMPLANT
SUT VIC AB 2-0 FS1 27 (SUTURE) ×1 IMPLANT
SUT VIC AB 2-0 SH 27 (SUTURE)
SUT VIC AB 2-0 SH 27XBRD (SUTURE) IMPLANT
SUT VIC AB 3-0 SH 27 (SUTURE) ×2
SUT VIC AB 3-0 SH 27X BRD (SUTURE) IMPLANT
SYR CONTROL 10ML LL (SYRINGE) ×3 IMPLANT
TOWEL GREEN STERILE (TOWEL DISPOSABLE) ×3 IMPLANT
TOWEL GREEN STERILE FF (TOWEL DISPOSABLE) ×3 IMPLANT
WATER STERILE IRR 1000ML POUR (IV SOLUTION) ×3 IMPLANT

## 2019-03-04 NOTE — Plan of Care (Signed)
  Problem: Pain Management: Goal: Pain level will decrease Outcome: Progressing   Problem: Safety: Goal: Ability to remain free from injury will improve Outcome: Progressing   Problem: Elimination: Goal: Will not experience complications related to urinary retention Outcome: Progressing   Problem: Elimination: Goal: Will not experience complications related to bowel motility Outcome: Progressing   Problem: Nutrition: Goal: Adequate nutrition will be maintained Outcome: Progressing

## 2019-03-04 NOTE — Plan of Care (Signed)
  Problem: Safety: Goal: Ability to remain free from injury will improve Outcome: Progressing   

## 2019-03-04 NOTE — Progress Notes (Signed)
PT Cancellation Note  Patient Details Name: Deshone Lyssy MRN: [id number] DOB: 22-Nov-1940   Cancelled Treatment:    Reason Eval/Treat Not Completed: Patient not medically ready(Pt awaiting hip pinning. will sign off and await post op orders)   Nakina Spatz B Shahab Polhamus 03/04/2019, 6:52 AM  Merryl Hacker, PT Acute Rehabilitation Services Pager: [phone] Office: [phone]

## 2019-03-04 NOTE — Anesthesia Preprocedure Evaluation (Signed)
Anesthesia Evaluation  Patient identified by MRN, date of birth, ID band Patient awake    Reviewed: Allergy & Precautions, NPO status , Patient's Chart, lab work & pertinent test results  Airway Mallampati: II  TM Distance: >3 FB Neck ROM: Full    Dental  (+) Edentulous Upper,    Pulmonary former smoker,    breath sounds clear to auscultation       Cardiovascular  Rhythm:Regular Rate:Normal     Neuro/Psych    GI/Hepatic   Endo/Other    Renal/GU      Musculoskeletal   Abdominal   Peds  Hematology   Anesthesia Other Findings   Reproductive/Obstetrics                             Anesthesia Physical Anesthesia Plan  ASA: III  Anesthesia Plan: MAC and Spinal   Post-op Pain Management:    Induction:   PONV Risk Score and Plan: Ondansetron and Dexamethasone  Airway Management Planned: Natural Airway and Simple Face Mask  Additional Equipment:   Intra-op Plan:   Post-operative Plan:   Informed Consent: I have reviewed the patients History and Physical, chart, labs and discussed the procedure including the risks, benefits and alternatives for the proposed anesthesia with the patient or authorized representative who has indicated his/her understanding and acceptance.       Plan Discussed with: Anesthesiologist  Anesthesia Plan Comments:         Anesthesia Quick Evaluation

## 2019-03-04 NOTE — Transfer of Care (Signed)
Immediate Anesthesia Transfer of Care Note  Patient: Treyon Allende  Procedure(s) Performed: HIP PINNING (Left Hip)  Patient Location: PACU  Anesthesia Type:MAC and Spinal  Level of Consciousness: drowsy, patient cooperative and responds to stimulation  Airway & Oxygen Therapy: Patient Spontanous Breathing and Patient connected to face mask oxygen  Post-op Assessment: Report given to RN and Post -op Vital signs reviewed and stable  Post vital signs: Reviewed and stable  Last Vitals:  Vitals Value Taken Time  BP 93/67 03/04/19 1401  Temp    Pulse 78 03/04/19 1406  Resp 14 03/04/19 1406  SpO2 92 % 03/04/19 1406  Vitals shown include unvalidated device data.  Last Pain:  Vitals:   03/04/19 1112  TempSrc:   PainSc: 5          Complications: No apparent anesthesia complications

## 2019-03-04 NOTE — Progress Notes (Signed)
OT Cancellation Note  Patient Details Name: Dow Blahnik MRN: [id number] DOB: 1940-12-13   Cancelled Treatment:    Reason Eval/Treat Not Completed: Patient not medically ready. Pt with femoral neck fx awaiting  hip pinning today. Plan to reattempt post-op. Please update OT orders as needed for post-op OT eval.   Raynald Kemp, OT Acute Rehabilitation Services Pager: [phone] Office: [phone]  03/04/2019, 9:43 AM

## 2019-03-04 NOTE — Op Note (Signed)
03/03/2019 - 03/04/2019  1:41 PM  PATIENT:  Mario Waters    PRE-OPERATIVE DIAGNOSIS: nondisplaced left femoral neck fracture  POST-OPERATIVE DIAGNOSIS:  Same  PROCEDURE:  CANNULATED LEFT HIP PINNING  SURGEON:  Eulas Post, MD  PHYSICIAN ASSISTANT: Janine Ores, PA-C,  present and scrubbed throughout the case, critical for completion in a timely fashion, and for retraction, instrumentation, and closure.  ANESTHESIA:   Spinal  ESTIMATED BLOOD LOSS: Minimal.  PREOPERATIVE INDICATIONS:  Mario Waters is a  79 y.o. male who fell and was found to have a diagnosis of femoral neck fracture who elected for surgical management.    The risks benefits and alternatives were discussed with the patient preoperatively including but not limited to the risks of infection, bleeding, nerve injury, cardiopulmonary complications, blood clots, malunion, nonunion, avascular necrosis, the need for revision surgery, the potential for conversion to hemiarthroplasty, among others, and the patient was willing to proceed.  OPERATIVE IMPLANTS: Zimmer 6.5 mm cannulated screws x3  OPERATIVE FINDINGS: Clinical osteoporosis with weak bone, proximal femur  OPERATIVE PROCEDURE: The patient was brought to the operating room and placed in supine position. IV antibiotics were given. Spinal anesthesia administered. Foley was also given with difficulty, had to use a smaller catheter to pass the prostate. The patient was placed on the fracture table. The operative extremity was positioned, without any significant reduction maneuver and was prepped and draped in usual sterile fashion.  Time out was performed.  Small incision was made distal to the greater trochanter, and 3 guidewires were introduced Into an inverted triangle configuration. The lengths were measured. The reduction was slightly valgus, and near-anatomic. I opened the cortex with a cannulated drill, and then placed the screws into position. Satisfactory  fixation was achieved.  The wounds were irrigated copiously, and repaired with Vicryl with Steri-Strips and sterile gauze. There no complications and the patient tolerated the procedure well.  The patient will be weightbearing as tolerated, and will be on Lovenox for a period of 4 weeks after discharge for DVT prophylaxis.

## 2019-03-04 NOTE — Progress Notes (Addendum)
Subjective:  Interview on 2/10:  In recalling events before the fall, patient has a few different iterations.  Upon interview (after admission) patient states he fell after standing from a seated position on the couch.  Prior to falling, patient felt lightheaded, had blurry vision and began sweating.  He was able to stand after the fall and used his wife's walker to assist with ambulation.  He denies SOB, racing heart, loss of consciousness, or head trauma.  Vision did not go completely dark.  He felt feverish at home, but did not check his temperature.  He states he has felt lightheaded in the past after standing from a seated position, but this has not caused a fall.  He has a cane and walker at home, but does not use them.  He had a headache while in the ED, which improved with hydration.  Patient reports he ate dinner shortly after his fall, and then vomited about one hour later.  He also reports having one episode of watery stools the evening prior to admission, though had to strain earlier that day.  His last seizure was 2 years ago and he is compliant with taking his seizure ppx medications each night.  Denies use of alcohol or other substances.  Patient does endorse decreased sensation in lower extremities. Patient denies difficulties with urination or dysuria.    PCP is Dustin Folks, NP at [facility] in [place].    Objective:  Vital signs in last 24 hours: Vitals:   03/03/19 1116 03/03/19 1234 03/03/19 2056 03/04/19 0338  BP: 132/67 118/65 132/64 122/65  Pulse: 94 88 97 88  Resp:  14 16 15   Temp:  98.8 F (37.1 C) 99.5 F (37.5 C) 98.6 F (37 C)  TempSrc:  Oral Oral Oral  SpO2:  94% 96% 95%  Weight:      Height:       Gen: Laying in bed, no distress, watching TV Lungs: Unlabored breathing Ext: Warm and well-perfused, no edema Neuro: Alert, conversational, full strength in the upper extremities Psych: Appropriate affect, not depressed or anxious  appearing   Assessment/Plan:  Principal Problem:   Fracture of femoral neck, left, closed (HCC) Active Problems:   Seizure disorder ([place])   Osteoporosis  Mario Waters is a 79 y.o male with seizure disorder and insomnia who presented to the emergency department with left hip pain after a fall admitted with nondisplaced fracture of the mid left femoral neck.  Left hip fracture 2/2 to fall:   Fall is likely a result of pre-syncope 2/2 to orthostatic hypotension vs. Vasovagal or situational.  He endorses lightheadedness upon standing from seated position prior to fall.  He also had an episode of watery diarrhea and nonbloody emesis (timing unclear), which may have contributed to hypovolemia.  Patient does not take anti-hypertensives or anti-cholinergic medications, though he does take Klonopin and duloxetine daily which may have contributed to balance issues.  Unable to collect orthostatics due to hip fracture.  Patient does not have dx of diabetes, and there was no evidence of hypoglycemia.  Unlikely cardiac etiology given unremarkable EKG and no heart palpitations, SOB or CP prior to fall.  No indication for Echo at this time. LE neuropathy may be contributing factor.   -  Ortho was consulted on 2/10 and surgery (cannulated hip pinning) was scheduled for today 2/12.   -  Patient was kept NPO after midnight last night in preparation for surgery -  Pain control with prn tylenol and  prn oxycodone 5mg , and prn fentanyl for severe pain -  Prn zofran for nausea -- (QT/QTc borderline prolonged 360/478 on 2/10) -  Continue bowel regimen bowel regimen MiraLAX 17 g daily and Senokot S 1 tablet BID -  PT/OT ordered -  Bisphosphonate therapy at outpatient follow-up given new dx of osteoporosis (low impact fall resulted in hip fracture) -  Recommended to discontinue clonazepam due to increased risk of falls  Seizure Disorder:  Last seizure 2 years ago.  Currently taking phenytoin and levetiracetem for  seizure ppx.  -  Continue Keppra 500 mg BID and Dilantin ER 150 mg QHS -  Phenytoin level pending  Anxiety / MDD - Continue Duloxetine DR 30 mg QD    LOS: 1 day   4/10, Medical Student 03/04/2019, 5:42 AM

## 2019-03-04 NOTE — Anesthesia Postprocedure Evaluation (Signed)
Anesthesia Post Note  Patient: Ervey Garceau  Procedure(s) Performed: HIP PINNING (Left Hip)     Patient location during evaluation: PACU Anesthesia Type: MAC Level of consciousness: oriented and awake and alert Pain management: pain level controlled Vital Signs Assessment: post-procedure vital signs reviewed and stable Respiratory status: spontaneous breathing, respiratory function stable and patient connected to nasal cannula oxygen Cardiovascular status: blood pressure returned to baseline and stable Postop Assessment: no headache, no backache and no apparent nausea or vomiting Anesthetic complications: no    Last Vitals:  Vitals:   03/04/19 1708 03/04/19 2001  BP: (!) 151/78 140/90  Pulse: 89 98  Resp: 15 18  Temp: 37.1 C 37.7 C  SpO2: 100% 97%    Last Pain:  Vitals:   03/04/19 2001  TempSrc: Oral  PainSc:                  Amair Shrout COKER

## 2019-03-04 NOTE — Anesthesia Procedure Notes (Signed)
Spinal  Patient location during procedure: OR Start time: 03/04/2019 12:40 PM End time: 03/04/2019 12:45 PM Staffing Performed: anesthesiologist  Anesthesiologist: Kipp Brood, MD Preanesthetic Checklist Completed: patient identified, IV checked, site marked, risks and benefits discussed, surgical consent, monitors and equipment checked, pre-op evaluation and timeout performed Spinal Block Patient position: left lateral decubitus Prep: DuraPrep Patient monitoring: heart rate, cardiac monitor, continuous pulse ox and blood pressure Approach: midline Location: L3-4 Injection technique: single-shot Needle Needle type: Sprotte and Tuohy  Needle gauge: 22 G Needle length: 9 cm Assessment Sensory level: T4 Additional Notes 1.7 cc 0.75% Bupivacaine injected easily

## 2019-03-04 NOTE — Progress Notes (Signed)
The patient has been re-examined, and the chart reviewed, and there have been no interval changes to the documented history and physical.    The risks benefits and alternatives were discussed with the patient including but not limited to the risks of nonoperative treatment, versus surgical intervention including infection, bleeding, nerve injury, malunion, nonunion, the need for revision surgery, hardware prominence, hardware failure, the need for hardware removal, blood clots, potential for future AVN and hip arthroplasty, cardiopulmonary complications, morbidity, mortality, among others, and they were willing to proceed.    Eulas Post, MD

## 2019-03-05 ENCOUNTER — Encounter: Payer: Self-pay | Admitting: *Deleted

## 2019-03-05 LAB — BASIC METABOLIC PANEL
Anion gap: 8 (ref 5–15)
BUN: 9 mg/dL (ref 8–23)
CO2: 27 mmol/L (ref 22–32)
Calcium: 7.8 mg/dL — ABNORMAL LOW (ref 8.9–10.3)
Chloride: 100 mmol/L (ref 98–111)
Creatinine, Ser: 1.03 mg/dL (ref 0.61–1.24)
GFR calc Af Amer: >60 mL/min (ref >60)
GFR calc non Af Amer: >60 mL/min (ref >60)
Glucose, Bld: 141 mg/dL — ABNORMAL HIGH (ref 70–99)
Potassium: 3.3 mmol/L — ABNORMAL LOW (ref 3.5–5.1)
Sodium: 135 mmol/L (ref 135–145)

## 2019-03-05 LAB — CBC
HCT: 35.5 % — ABNORMAL LOW (ref 39.0–52.0)
Hemoglobin: 12.2 g/dL — ABNORMAL LOW (ref 13.0–17.0)
MCH: 32.5 pg (ref 26.0–34.0)
MCHC: 34.4 g/dL (ref 30.0–36.0)
MCV: 94.7 fL (ref 80.0–100.0)
Platelets: 164 10*3/uL (ref 150–400)
RBC: 3.75 MIL/uL — ABNORMAL LOW (ref 4.22–5.81)
RDW: 13.1 % (ref 11.5–15.5)
WBC: 7.5 10*3/uL (ref 4.0–10.5)
nRBC: 0 % (ref 0.0–0.2)

## 2019-03-05 MED ORDER — ENOXAPARIN SODIUM 40 MG/0.4ML ~~LOC~~ SOLN: 40.0000 mg | SUBCUTANEOUS | 0 refills | Status: DC

## 2019-03-05 MED ORDER — CHLORHEXIDINE GLUCONATE CLOTH 2 % EX PADS
6.0000 | MEDICATED_PAD | Freq: Every day | CUTANEOUS | Status: DC
  Administered 2019-03-05: 6 via TOPICAL

## 2019-03-05 MED ORDER — POTASSIUM CHLORIDE CRYS ER 20 MEQ PO TBCR
40.0000 meq | EXTENDED_RELEASE_TABLET | Freq: Once | ORAL | Status: AC
  Administered 2019-03-05: 09:00:00 40 meq via ORAL
  Filled 2019-03-05: qty 2

## 2019-03-05 NOTE — Plan of Care (Signed)
  Problem: Pain Managment: Goal: General experience of comfort will improve Outcome: Progressing   

## 2019-03-05 NOTE — Progress Notes (Signed)
Subjective:  Mr. Mario Waters states pain is 7/10 this morning.  He reiterated that he felt dizzy prior to his fall and this has been happening when he stands from a seated position.  He denies taking Klonopin recently and only takes it as needed.    Objective:  Vital signs in last 24 hours: Vitals:   03/04/19 1708 03/04/19 2001 03/04/19 2354 03/05/19 0508  BP: (!) 151/78 140/90 (!) 115/58 106/71  Pulse: 89 98 84 76  Resp: 15 18 17 14   Temp: 98.8 F (37.1 C) 99.8 F (37.7 C) 99 F (37.2 C) 98.4 F (36.9 C)  TempSrc: Oral Oral Oral Oral  SpO2: 100% 97% 94% 93%  Weight:      Height:       Gen: Laying in bed, no distress Lungs: Unlabored breathing Ext: Warm and well-perfused, no edema Neuro: Alert, conversational Psych: Appropriate affect, not depressed or anxious appearing  Labs: CBC Latest Ref Rng & Units 03/05/2019 03/04/2019 03/03/2019  WBC 4.0 - 10.5 K/uL 7.5 7.4 10.0  Hemoglobin 13.0 - 17.0 g/dL 12.2(L) 13.0 13.9  Hematocrit 39.0 - 52.0 % 35.5(L) 38.1(L) 42.7  Platelets 150 - 400 K/uL 164 164 182   CMP Latest Ref Rng & Units 03/05/2019 03/04/2019 03/03/2019  Glucose 70 - 99 mg/dL 141(H) 133(H) 147(H)  BUN 8 - 23 mg/dL 9 10 16   Creatinine 0.61 - 1.24 mg/dL 1.03 0.86 0.86  Sodium 135 - 145 mmol/L 135 134(L) 140  Potassium 3.5 - 5.1 mmol/L 3.3(L) 3.4(L) 4.2  Chloride 98 - 111 mmol/L 100 100 105  CO2 22 - 32 mmol/L 27 27 22   Calcium 8.9 - 10.3 mg/dL 7.8(L) 7.9(L) 8.6(L)  Total Protein 6.0 - 8.5 g/dL - - -  Total Bilirubin 0.0 - 1.2 mg/dL - - -  Alkaline Phos 39 - 117 IU/L - - -  AST 0 - 40 IU/L - - -  ALT 0 - 44 IU/L - - -   EKG 2/1//2021:   Sinus tachycardia Nonspecific T wave abnormality Abnormal ECG   Assessment/Plan:  Principal Problem:   Fracture of femoral neck, left, closed (HCC) Active Problems:   Seizure disorder ([place])   Osteoporosis   Mario Waters is a 79 y.o male with seizure disorder and insomnia who presented to the emergency department with left  hip pain after a fall admitted with nondisplaced fracture of the mid left femoral neck.  Left hip fracture 2/2 to fall s/p cannulated hip pinning (03/04/2019):   Fall is likely a result of pre-syncope 2/2 to orthostatic hypotension vs. Vasovagal or situational.  He endorses lightheadedness upon standing from seated position prior to fall.  He also had an episode of watery diarrhea and nonbloody emesis (timing unclear), which may have contributed to hypovolemia.  Unable to collect orthostatics due to hip fracture. Patient does not take anti-hypertensives or anti-cholinergic medications.  Correction from note yesterday-- patient does not take Klonpin daily, takes only as needed.   LE neuropathy may be contributing factor. Unlikely cardiac etiology (see previous note).  -  Ortho recs:  The patient will be weightbearing as tolerated, and will be on Lovenox for a period of 4 weeks after discharge for DVT prophylaxis. -  Pain control with prn tylenol and prn oxycodone 5mg , and prn fentanyl for severe pain -  Prn zofran for nausea -- (QT/QTc borderline prolonged 360/478 on 2/10) -  Continue bowel regimen bowel regimen MiraLAX 17 g daily and Senokot S 1 tablet BID -  PT/OT ordered  -  Bisphosphonate therapy at outpatient follow-up given new dx of osteoporosis (low impact fall resulted in hip fracture) -  Recommended to discontinue clonazepam due to increased risk of falls  Seizure Disorder:   Last seizure 2 years ago.  Currently taking phenytoin and levetiracetem for seizure ppx.  On 2/10, free phenytoin was not detected and total phenytoin was low (3.4). -  Continue Keppra 500 mg BID and Dilantin ER 150 mg QHS  Anxiety / MDD - Continue Duloxetine DR 30 mg QD    LOS: 2 days   Pauline Aus, Medical Student 03/05/2019, 6:42 AM

## 2019-03-05 NOTE — TOC Initial Note (Addendum)
Transition of Care [facility]) - Initial/Assessment Note    Patient Details  Name: Mario Waters MRN: [id number] Date of Birth: 11-May-1940  [facility] of Care [facility] CM/SW Contact:    Kingsley Plan, RN Phone Number: 03/05/2019, 12:05 PM  Clinical Narrative:                 Was messaged by Internal Medicine to arrange SNF placement. No PT note yet however patient states PT worked with him and rec HHPT. Will confirm with PT.   Patient from home with wife. Has walker and 3 in1 already. Provided choice . Patient has no preference.   Patient's address is [address] [place]  Expected Discharge Plan: Home w Home Health Services     Patient Goals and CMS Choice Patient states their goals for this hospitalization and ongoing recovery are:: to go home CMS Medicare.gov Compare Post Acute Care list provided to:: Patient Choice offered to / list presented to : Patient  Expected Discharge Plan and Services Expected Discharge Plan: Home w Home Health Services   Discharge Planning Services: CM Consult   Living arrangements for the past 2 months: Apartment                 DME Arranged: N/A         HH Arranged: PT          Prior Living Arrangements/Services Living arrangements for the past 2 months: Apartment Lives with:: Spouse Patient language and need for interpreter reviewed:: Yes Do you feel safe going back to the place where you live?: Yes      Need for Family Participation in Patient Care: Yes (Comment) Care giver support system in place?: Yes (comment) Current home services: DME Criminal Activity/Legal Involvement Pertinent to Current Situation/Hospitalization: No - Comment as needed  Activities of Daily Living Home Assistive Devices/Equipment: None ADL Screening (condition at time of admission) Patient's cognitive ability adequate to safely complete daily activities?: Yes Is the patient deaf or have difficulty hearing?: No Does the patient have  difficulty seeing, even when wearing glasses/contacts?: No Does the patient have difficulty concentrating, remembering, or making decisions?: No Patient able to express need for assistance with ADLs?: Yes Does the patient have difficulty dressing or bathing?: No Independently performs ADLs?: Yes (appropriate for developmental age) Does the patient have difficulty walking or climbing stairs?: Yes Weakness of Legs: Left Weakness of Arms/Hands: None  Permission Sought/Granted   Permission granted to share information with : No              Emotional Assessment Appearance:: Appears stated age Attitude/Demeanor/Rapport: Engaged Affect (typically observed): Accepting Orientation: : Oriented to Self, Oriented to Place, Oriented to  Time, Oriented to Situation Alcohol / Substance Use: Not Applicable Psych Involvement: No (comment)  Admission diagnosis:  Hip fracture (HCC) [S72.009A] Closed fracture of left hip, initial encounter (HCC) [S72.002A] Fall, initial encounter [W19.XXXA] Patient Active Problem List   Diagnosis Date Noted  . Osteoporosis 03/04/2019  . Fracture of femoral neck, left, closed (HCC) 03/03/2019  . Benign prostatic hyperplasia 12/08/2017  . Primary osteoarthritis of both knees 06/26/2016  . Peripheral neuropathy 09/21/2015  . Seizure disorder (HCC) 09/21/2015   PCP:  Iona Hansen, NP Pharmacy:   [facility] [address] - [place], [place] - [address] [place] [address] Phone: [phone] Fax: [phone]  [facility] [phone] - [place], [place] - [phone] AT [facility] [address]  [place] [address] Phone: [phone] Fax: [phone]     Social Determinants of Health (SDOH) Interventions    Readmission Risk Interventions No flowsheet data found.

## 2019-03-05 NOTE — Discharge Instructions (Signed)
It was a pleasure taking care of you, Mario Waters.  You were hospitalized for a hip fracture after having a fall.  We have arranged for Home Health Physical Therapy to visit you at your home to help you build strength and balance in your recovery period.  We have also sent you home on a medication called Lovenox to help prevent blood clots.  This is a medication that you will have to inject daily (see instructions below).  Please stop taking clonazepam (Klonopin), as this medication can cause you to become off-balance, resulting in falls.  Follow-up with your primary care provider within one week of discharge.    ___________________________________________  Diet: As you were doing prior to hospitalization   Shower:  May shower but keep the wounds dry, use an occlusive plastic wrap, NO SOAKING IN TUB.  If the bandage gets wet, change with a clean dry gauze.  If you have a splint on, leave the splint in place and keep the splint dry with a plastic bag.  Dressing:  You may change your dressing 3-5 days after surgery, unless you have a splint.  If you have a splint, then just leave the splint in place and we will change your bandages during your first follow-up appointment.    If you had hand or foot surgery, we will plan to remove your stitches in about 2 weeks in the office.  For all other surgeries, there are sticky tapes (steri-strips) on your wounds and all the stitches are absorbable.  Leave the steri-strips in place when changing your dressings, they will peel off with time, usually 2-3 weeks.  Activity:  Increase activity slowly as tolerated, but follow the weight bearing instructions below.  The rules on driving is that you can not be taking narcotics while you drive, and you must feel in control of the vehicle.    Weight Bearing:   Weight bearing as tolerated - left leg.    To prevent constipation: you may use a stool softener such as -  Colace (over the counter) 100 mg by mouth twice a day    Drink plenty of fluids (prune juice may be helpful) and high fiber foods Miralax (over the counter) for constipation as needed.    Itching:  If you experience itching with your medications, try taking only a single pain pill, or even half a pain pill at a time.  You may take up to 10 pain pills per day, and you can also use benadryl over the counter for itching or also to help with sleep.   Precautions:  If you experience chest pain or shortness of breath - call 911 immediately for transfer to the hospital emergency department!!  If you develop a fever greater that 101 F, purulent drainage from wound, increased redness or drainage from wound, or calf pain -- Call the office at [phone]                                                Follow- Up Appointment:  Please call for an appointment to be seen in 2 weeks [facility] - [phone]  Hip Fracture  A hip fracture is a break in the upper part of the thigh bone (femur). This is usually the result of an injury, commonly a fall. What are the causes? This condition may be caused by:  A  direct hit or injury (trauma) to the side of the hip, such as from a fall or a car accident. What increases the risk? You are more likely to develop this condition if:  You have poor balance or an unsteady walking pattern (gait). Certain conditions contribute to poor balance, including Parkinson disease and dementia.  You have thinning or weakening of your bones, such as from osteopenia or osteoporosis.  You have cancer that spreads to the leg bones.  You have certain conditions that can weaken your bones, such as thyroid disorders, intestine disorders, or a lack (deficiency) of certain nutrients.  You smoke.  You take certain medicines, such as steroids.  You have a history of broken bones. What are the signs or symptoms? Symptoms of this condition include:  Pain over the injured hip. This is commonly felt on the side of the hip or in the front  groin area.  Stiffness, bruising, and swelling over the hip.  Pain with movement of the leg, especially lifting it up. Pain often gets better with rest.  Difficulty or inability to stand, walk, or use the leg to support body weight (put weight on the leg).  The leg rolling outward when lying down.  The affected leg being shorter than the other leg. How is this diagnosed? This condition may be diagnosed based on:  Your symptoms.  A physical exam.  X-rays. These may be done: ? To confirm the diagnosis. ? To determine the type and location of the fracture. ? To check for other injuries.  MRI or CT scans. These may be done if the fracture is not visible on an X-ray. How is this treated? Treatment for this condition depends on the severity and location of your fracture. In most cases, surgery is necessary. Surgery may involve:  Repairing the fracture with a screw, nail, or rod to hold the bone in place (open reduction and internal fixation, ORIF).  Replacing the damaged parts of the femur with metal implants (hemiarthroplasty or arthroplasty). If your fracture is less severe, or if you are not eligible for surgery, you may have non-surgical treatment. Non-surgical treatment may involve:  Using crutches, a walker, or a wheelchair until your health care provider says that you can support (bear) weight on your hip.  Medicines to help reduce pain and swelling.  Having regular X-rays to monitor your fracture and make sure that it is healing.  Physical therapy. You may need physical therapy after surgery, too. Follow these instructions at home: Activity  Do not use your injured leg to support your body weight until your health care provider says that you can. ? Follow standing and walking restrictions as told by your health care provider. ? Use crutches, a walker, or a wheelchair as directed.  Avoid any activities that cause pain or irritation in your hip. Ask your health care  provider what activities are safe for you.  Do not drive or use heavy machinery until your health care provider approves.  If physical therapy was prescribed, do exercises as told by your health care provider. General instructions  Take over-the-counter and prescription medicines only as told by your health care provider.  If directed, put ice on the injured area: ? Put ice in a plastic bag. ? Place a towel between your skin and the bag. ? Leave the ice on for 20 minutes, 2-3 times a day.  Do not use any products that contain nicotine or tobacco, such as cigarettes and e-cigarettes. These can delay  bone healing. If you need help quitting, ask your health care provider.  Keep all follow-up visits as told by your health care provider. This is important. How is this prevented?  To prevent falls at home: ? Use a cane, walker, or wheelchair as directed. ? Make sure your rooms and hallways are free of clutter, obstacles, and cords. ? Install grab bars in your bedroom and bathrooms. ? Always use handrails when going up and down stairs. ? Use nightlights around the house.  Exercise regularly. Ask what forms of exercise are safe for you, such as walking and strength and balance exercises.  Visit an eye doctor regularly to have your eyesight checked. This can help prevent falls.  Make sure you get enough calcium and vitamin D.  Do not use any products that contain nicotine or tobacco, such as cigarettes and e-cigarettes. If you need help quitting, ask your health care provider.  Limit alcohol use.  If you have an underlying condition that caused your hip fracture, work with your health care provider to manage your condition. Contact a health care provider if:  Your pain gets worse or it does not get better with rest or medicine.  You develop any of the following in your leg or foot: ? Numbness. ? Tingling. ? A change in skin color (discoloration). ? Skin feeling cold to the  touch. Get help right away if:  Your pain suddenly gets worse.  You cannot move your hip. Summary  A hip fracture is a break in the upper part of the thigh bone (femur).  Treatment typically require surgical management to restore stability and function to the hip.  Pain medicine and icing of the affected leg can help manage pain and swelling. Follow directions as told by your health care provider. This information is not intended to replace advice given to you by your health care provider. Make sure you discuss any questions you have with your health care provider. Document Revised: 09/27/2017 Document Reviewed: 02/10/2016 Elsevier Patient Education  2020 Elsevier Inc.  _______________________________________________________________  Enoxaparin injection What is this medicine? ENOXAPARIN (ee nox a PA rin) is used after knee, hip, or abdominal surgeries to prevent blood clotting. It is also used to treat existing blood clots in the lungs or in the veins. This medicine may be used for other purposes; ask your health care provider or pharmacist if you have questions. COMMON BRAND NAME(S): Lovenox What should I tell my health care provider before I take this medicine? They need to know if you have any of these conditions:  bleeding disorders, hemorrhage, or hemophilia  infection of the heart or heart valves  kidney or liver disease  previous stroke  prosthetic heart valve  recent surgery or delivery of a baby  ulcer in the stomach or intestine, diverticulitis, or other bowel disease  an unusual or allergic reaction to enoxaparin, heparin, pork or pork products, other medicines, foods, dyes, or preservatives  pregnant or trying to get pregnant  breast-feeding How should I use this medicine? This medicine is for injection under the skin. It is usually given by a health-care professional. You or a family member may be trained on how to give the injections. If you are to give  yourself injections, make sure you understand how to use the syringe, measure the dose if necessary, and give the injection. To avoid bruising, do not rub the site where this medicine has been injected. Do not take your medicine more often than directed. Do not  stop taking except on the advice of your doctor or health care professional. Make sure you receive a puncture-resistant container to dispose of the needles and syringes once you have finished with them. Do not reuse these items. Return the container to your doctor or health care professional for proper disposal. Talk to your pediatrician regarding the use of this medicine in children. Special care may be needed. Overdosage: If you think you have taken too much of this medicine contact a poison control center or emergency room at once. NOTE: This medicine is only for you. Do not share this medicine with others. What if I miss a dose? If you miss a dose, take it as soon as you can. If it is almost time for your next dose, take only that dose. Do not take double or extra doses. What may interact with this medicine?  aspirin and aspirin-like medicines  certain medicines that treat or prevent blood clots  dipyridamole  NSAIDs, medicines for pain and inflammation, like ibuprofen or naproxen This list may not describe all possible interactions. Give your health care provider a list of all the medicines, herbs, non-prescription drugs, or dietary supplements you use. Also tell them if you smoke, drink alcohol, or use illegal drugs. Some items may interact with your medicine. What should I watch for while using this medicine? Visit your healthcare professional for regular checks on your progress. You may need blood work done while you are taking this medicine. Your condition will be monitored carefully while you are receiving this medicine. It is important not to miss any appointments. If you are going to need surgery or other procedure, tell your  healthcare professional that you are using this medicine. Using this medicine for a long time may weaken your bones and increase the risk of bone fractures. Avoid sports and activities that might cause injury while you are using this medicine. Severe falls or injuries can cause unseen bleeding. Be careful when using sharp tools or knives. Consider using an Copy. Take special care brushing or flossing your teeth. Report any injuries, bruising, or red spots on the skin to your healthcare professional. Wear a medical ID bracelet or chain. Carry a card that describes your disease and details of your medicine and dosage times. What side effects may I notice from receiving this medicine? Side effects that you should report to your doctor or health care professional as soon as possible:  allergic reactions like skin rash, itching or hives, swelling of the face, lips, or tongue  bone pain  signs and symptoms of bleeding such as bloody or black, tarry stools; red or dark-brown urine; spitting up blood or brown material that looks like coffee grounds; red spots on the skin; unusual bruising or bleeding from the eye, gums, or nose  signs and symptoms of a blood clot such as chest pain; shortness of breath; pain, swelling, or warmth in the leg  signs and symptoms of a stroke such as changes in vision; confusion; trouble speaking or understanding; severe headaches; sudden numbness or weakness of the face, arm or leg; trouble walking; dizziness; loss of coordination Side effects that usually do not require medical attention (report to your doctor or health care professional if they continue or are bothersome):  hair loss  pain, redness, or irritation at site where injected This list may not describe all possible side effects. Call your doctor for medical advice about side effects. You may report side effects to FDA at 1-800-FDA-1088. Where should  I keep my medicine? Keep out of the reach of  children. Store at room temperature between 15 and 30 degrees C (59 and 86 degrees F). Do not freeze. If your injections have been specially prepared, you may need to store them in the refrigerator. Ask your pharmacist. Throw away any unused medicine after the expiration date. NOTE: This sheet is a summary. It may not cover all possible information. If you have questions about this medicine, talk to your doctor, pharmacist, or health care provider.  2020 Elsevier/Gold Standard (2017-01-02 11:25:34)  _________________________________________________

## 2019-03-05 NOTE — Discharge Summary (Signed)
Name: Mario Waters MRN: [id number] DOB: [birth year] 79 y.o. PCP: Iona Hansen, NP  Date of Admission: 03/03/2019  5:33 AM Date of Discharge: 03/05/19 Attending Physician: Tyson Alias, *  Discharge Diagnosis: Left hip fracture 2/2 to fall s/p cannulated hip pinning (03/04/2019) Osteoporosis  Discharge Medications: Allergies as of 03/05/2019   No Known Allergies     Medication List    STOP taking these medications   clonazePAM 0.5 MG tablet Commonly known as: KLONOPIN   ibuprofen 600 MG tablet Commonly known as: ADVIL     TAKE these medications   acetaminophen 500 MG tablet Commonly known as: TYLENOL Take 1,000 mg by mouth every 6 (six) hours as needed for mild pain.   albuterol 108 (90 Base) MCG/ACT inhaler Commonly known as: VENTOLIN HFA Inhale 2 puffs into the lungs every 4 (four) hours as needed for wheezing or shortness of breath.   atorvastatin 20 MG tablet Commonly known as: LIPITOR Take 20 mg by mouth at bedtime.   cyclobenzaprine 5 MG tablet Commonly known as: FLEXERIL Take 5 mg by mouth 2 (two) times daily as needed for muscle spasms.   diclofenac Sodium 1 % Gel Commonly known as: VOLTAREN Apply 4 g topically 4 (four) times daily as needed (for joints).   DULoxetine 30 MG capsule Commonly known as: Cymbalta Take 1 capsule (30 mg total) by mouth at bedtime.   enoxaparin 40 MG/0.4ML injection Commonly known as: LOVENOX Inject 0.4 mLs (40 mg total) into the skin daily. Start taking on: March 06, 2019   levETIRAcetam 500 MG tablet Commonly known as: KEPPRA Take 1 tablet (500 mg total) by mouth 2 (two) times daily.   Lyrica 75 MG capsule Generic drug: pregabalin Take 75 mg by mouth 2 (two) times daily.   Omega-3 Fish Oil 300 MG Caps Take by mouth daily.   pantoprazole 20 MG tablet Commonly known as: PROTONIX Take 20 mg by mouth daily.   phenytoin 100 MG ER capsule Commonly known as: DILANTIN Take 1 capsule (100 mg total) by  mouth at bedtime.   Phenytoin Infatabs 50 MG tablet Generic drug: phenytoin CHEW AND SWALLOW 1 TABLET BY MOUTH AT BEDTIME What changed: See the new instructions.   PreserVision AREDS 2 Caps Take 2 capsules by mouth.   Vitamin D 50 MCG (2000 UT) Caps Take 2,000 Units by mouth daily.       Disposition and follow-up:   Mr.Mario Waters was discharged from [facility] in Stable condition.  At the hospital follow up visit please address:  1.  Left hip fracture 2/2 to fall  Osteoporosis:    -  Continue Home health PT -  Continue Lovenox for 4 weeks post-discharge for DVT/PE ppx -follow up with orthopedics -  Consider bisphosphonate therapy in outpatient setting given new dx of osteoporosis -  Discontinued Klonopin due to risk of falls  2.  Labs / imaging needed at time of follow-up: n/a  3.  Pending labs/ test needing follow-up: n/a  Follow-up Appointments: Follow-up Information    Teryl Lucy, MD. Schedule an appointment as soon as possible for a visit in 2 weeks.   Specialty: Orthopedic Surgery Contact information: [address] [place] [address] [phone]        [facility], [facility] Follow up.   Specialty: Home Health Services Contact information: [address] [place] [address] [phone]        Iona Hansen, NP. Go on  03/19/2019.   Specialty: Nurse Practitioner Why: 3:40pm Contact information: [place] [id number] [phone]           Hospital Course by problem list:  Mario Waters is a 52 y.omale with seizure disorder and insomnia who presented to the emergency department with left hip pain after a fall admitted with nondisplaced fracture of the mid left femoral neck.  Left hip fracture 2/2 to fall  Osteoporosis:  Patient underwent cannulated hip pinning on 03/04/19.  Fall is likely a result of pre-syncope 2/2 to orthostatic hypotension vs.  Vasovagal or situational.  He endorsed lightheadedness upon standing from seated position prior to fall.  He also had an episode of watery diarrhea and nonbloody emesis on the same day (timing unclear), which may have contributed to hypovolemia.  Unable to collect orthostatics due to hip fracture.  He takes Klonopin as needed (not daily), which may contribute to loss of balance. LE neuropathy may also be contributing factor.  Of note, patient does not take anti-hypertensives or anti-cholinergic medications. Unlikely cardiac etiology (EKG unremarkable, no CP, palpitations or SOB prior to fall).  Patient reports last seizure was 2 years ago.  Per ortho recs, patient will be weightbearing as tolerated, and will be on Lovenox for a period of4weeks after discharge for DVT prophylaxis.  Recommended discontinuation of Klonopin.  Given low-impact fall resulting in hip fracture, patient was diagnosed with osteoporosis.  We recommend starting outpatient bisphosphonate therapy.   Discharge Vitals:   BP (!) 121/51 (BP Location: Right Arm)   Pulse 81   Temp 97.8 F (36.6 C) (Oral)   Resp 17   Ht 5\' 11"  (1.803 m)   Wt 67.7 kg   SpO2 93%   BMI 20.82 kg/m   Pertinent Labs, Studies, and Procedures:   CBC Latest Ref Rng & Units 03/05/2019 03/04/2019 03/03/2019  WBC 4.0 - 10.5 K/uL 7.5 7.4 10.0  Hemoglobin 13.0 - 17.0 g/dL 12.2(L) 13.0 13.9  Hematocrit 39.0 - 52.0 % 35.5(L) 38.1(L) 42.7  Platelets 150 - 400 K/uL 164 164 182   CMP Latest Ref Rng & Units 03/05/2019 03/04/2019 03/03/2019  Glucose 70 - 99 mg/dL 141(H) 133(H) 147(H)  BUN 8 - 23 mg/dL 9 10 16   Creatinine 0.61 - 1.24 mg/dL 1.03 0.86 0.86  Sodium 135 - 145 mmol/L 135 134(L) 140  Potassium 3.5 - 5.1 mmol/L 3.3(L) 3.4(L) 4.2  Chloride 98 - 111 mmol/L 100 100 105  CO2 22 - 32 mmol/L 27 27 22   Calcium 8.9 - 10.3 mg/dL 7.8(L) 7.9(L) 8.6(L)  Total Protein 6.0 - 8.5 g/dL - - -  Total Bilirubin 0.0 - 1.2 mg/dL - - -  Alkaline Phos 39 - 117 IU/L - - -    AST 0 - 40 IU/L - - -  ALT 0 - 44 IU/L - - -   CT Head without Contrast  No evidence of intracranial injury. Unremarkable for age.  CT Left Hip without Contrast 1. CT confirms the presence of a nondisplaced fracture of the mid left femoral neck. 2. No pelvic fracture or dislocation. No significant left hip arthropathy for age.  Discharge Instructions: Discharge Instructions    Call MD for:  extreme fatigue   Complete by: As directed    Call MD for:  persistant dizziness or light-headedness   Complete by: As directed    Call MD for:  redness, tenderness, or signs of infection (pain, swelling, redness, odor or green/yellow discharge around incision site)  Complete by: As directed    Diet - low sodium heart healthy   Complete by: As directed    Increase activity slowly   Complete by: As directed       Pauline Aus, Medical Student 03/05/2019, 12:58 PM    Signed:  Lorenso Courier, MD Internal Medicine PGY3

## 2019-03-05 NOTE — Progress Notes (Signed)
     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 He was found to have a non-displaced left femoral neck fracture and a cannulated left hip pinning was performed by Dr. Dion Saucier.   Post-op Day 1: cannulated left hip pinning Patient reports pain as mild in left hip.  He denies nausea, vomiting, or lightheadedness.   Objective:   VITALS:   Vitals:   03/04/19 1708 03/04/19 2001 03/04/19 2354 03/05/19 0508  BP: (!) 151/78 140/90 (!) 115/58 106/71  Pulse: 89 98 84 76  Resp: 15 18 17 14   Temp: 98.8 F (37.1 C) 99.8 F (37.7 C) 99 F (37.2 C) 98.4 F (36.9 C)  TempSrc: Oral Oral Oral Oral  SpO2: 100% 97% 94% 93%  Weight:      Height:       PE: Patient seated comfortably in bed, in no acute distress. ABD soft Neurovascular intact Sensation intact distally Intact pulses distally Dorsiflexion/Plantar flexion intact Incision: dressing C/D/I  Lab Results  Component Value Date   WBC 7.5 03/05/2019   HGB 12.2 (L) 03/05/2019   HCT 35.5 (L) 03/05/2019   MCV 94.7 03/05/2019   PLT 164 03/05/2019   BMET    Component Value Date/Time   NA 135 03/05/2019 0523   NA 144 07/13/2018 1332   K 3.3 (L) 03/05/2019 0523   CL 100 03/05/2019 0523   CO2 27 03/05/2019 0523   GLUCOSE 141 (H) 03/05/2019 0523   BUN 9 03/05/2019 0523   BUN 12 07/13/2018 1332   CREATININE 1.03 03/05/2019 0523   CREATININE 0.95 09/21/2015 1327   CALCIUM 7.8 (L) 03/05/2019 0523   GFRNONAA >60 03/05/2019 0523   GFRAA >60 03/05/2019 0523     Assessment/Plan: 1 Day Post-Op   Principal Problem:   Fracture of femoral neck, left, closed (HCC) Active Problems:   Seizure disorder (HCC)   Osteoporosis  Post-op Day 1: cannulated left hip pinning - Up with therapy, WBAT LLE - DVT prophylaxis - Lovenox and SCD's - advance diet - patient lives at home  with wife in a single-story home and at baseline ambulates without assistance, plan for discharge back to home when medically ready    05/03/2019 03/05/2019, 6:48 AM   05/03/2019, MD Cell [phone]

## 2019-03-05 NOTE — Plan of Care (Signed)
  Problem: Pain Management: Goal: Pain level will decrease Outcome: Progressing   Problem: Activity: Goal: Ability to ambulate and perform ADLs will improve Outcome: Progressing   Problem: Clinical Measurements: Goal: Postoperative complications will be avoided or minimized Outcome: Progressing   Problem: Safety: Goal: Ability to remain free from injury will improve Outcome: Progressing

## 2019-03-05 NOTE — Evaluation (Signed)
Physical Therapy Evaluation Patient Details Name: Mario Waters MRN: [id number] DOB: 1940-05-14 Today's Date: 03/05/2019   History of Present Illness  78 admitted after fall at home into coffee table with left femur fx s/p pinning. PMHx: neuropathy, seizure disorder  Clinical Impression  Pt very pleasant and moving well s/p hip pinning. Pt reports being very mobile and that wife is in good health to assist other than cellulitis. Pt minguard level for all mobility with use of RW for gait and return home with HHPT is appropriate. Pt with decreased strength and ROM LLE as well as decreased transfers and gait who will benefit from acute therapy to maximize mobility, safety and function to decrease burden of care.      Follow Up Recommendations Home health PT    Equipment Recommendations  None recommended by PT    Recommendations for Other Services OT consult     Precautions / Restrictions Precautions Precautions: Fall Restrictions LLE Weight Bearing: Weight bearing as tolerated      Mobility  Bed Mobility Overal bed mobility: Needs Assistance Bed Mobility: Supine to Sit     Supine to sit: Min guard     General bed mobility comments: HOB flat with increased time and pt able to pivot to left side of bed  Transfers Overall transfer level: Needs assistance   Transfers: Sit to/from Stand Sit to Stand: Min guard         General transfer comment: cues for hand placement and sequence  Ambulation/Gait Ambulation/Gait assistance: Min guard Gait Distance (Feet): 150 Feet Assistive device: Rolling walker (2 wheeled) Gait Pattern/deviations: Step-through pattern;Decreased stride length;Decreased weight shift to left   Gait velocity interpretation: 1.31 - 2.62 ft/sec, indicative of limited community ambulator General Gait Details: pt with good stability and step through pattern with RW with gait. Pt reports 8/10 pain with gait but self directed distance  Stairs             Wheelchair Mobility    Modified Rankin (Stroke Patients Only)       Balance Overall balance assessment: History of Falls                                           Pertinent Vitals/Pain Pain Assessment: 0-10 Pain Score: 8  Pain Location: left hip Pain Descriptors / Indicators: Aching Pain Intervention(s): Limited activity within patient's tolerance;Monitored during session;Repositioned;Patient requesting pain meds-RN notified    Home Living Family/patient expects to be discharged to:: Private residence Living Arrangements: Spouse/significant other Available Help at Discharge: Family;Available 24 hours/day Type of Home: House Home Access: Level entry     Home Layout: One level Home Equipment: Walker - 2 wheels;Cane - single point      Prior Function Level of Independence: Independent               Hand Dominance        Extremity/Trunk Assessment   Upper Extremity Assessment Upper Extremity Assessment: Overall WFL for tasks assessed    Lower Extremity Assessment Lower Extremity Assessment: LLE deficits/detail LLE Deficits / Details: decreased ROM and strength due to post op pain    Cervical / Trunk Assessment Cervical / Trunk Assessment: Normal  Communication   Communication: No difficulties  Cognition Arousal/Alertness: Awake/alert Behavior During Therapy: WFL for tasks assessed/performed Overall Cognitive Status: Within Functional Limits for tasks assessed  General Comments      Exercises General Exercises - Lower Extremity Long Arc Quad: AROM;Left;Seated;10 reps Hip Flexion/Marching: AROM;Left;Seated;10 reps   Assessment/Plan    PT Assessment Patient needs continued PT services  PT Problem List Decreased strength;Decreased mobility;Decreased range of motion;Decreased activity tolerance;Decreased balance;Decreased knowledge of use of DME;Pain       PT Treatment  Interventions Functional mobility training;Therapeutic activities;Patient/family education;Gait training;Balance training;DME instruction;Therapeutic exercise    PT Goals (Current goals can be found in the Care Plan section)  Acute Rehab PT Goals Patient Stated Goal: return home to garden PT Goal Formulation: With patient Time For Goal Achievement: 03/19/19 Potential to Achieve Goals: Good    Frequency Min 5X/week   Barriers to discharge Decreased caregiver support      Co-evaluation               AM-PAC PT "6 Clicks" Mobility  Outcome Measure Help needed turning from your back to your side while in a flat bed without using bedrails?: A Little Help needed moving from lying on your back to sitting on the side of a flat bed without using bedrails?: A Little Help needed moving to and from a bed to a chair (including a wheelchair)?: A Little Help needed standing up from a chair using your arms (e.g., wheelchair or bedside chair)?: A Little Help needed to walk in hospital room?: A Little Help needed climbing 3-5 steps with a railing? : A Little 6 Click Score: 18    End of Session Equipment Utilized During Treatment: Gait belt Activity Tolerance: Patient tolerated treatment well Patient left: in chair;with call bell/phone within reach;with chair alarm set Nurse Communication: Mobility status PT Visit Diagnosis: Other abnormalities of gait and mobility (R26.89);Difficulty in walking, not elsewhere classified (R26.2);Muscle weakness (generalized) (M62.81)    Time: 1041-1100 PT Time Calculation (min) (ACUTE ONLY): 19 min   Charges:   PT Evaluation $PT Eval Moderate Complexity: 1 Mod          Ellenora Talton P, PT Acute Rehabilitation Services Pager: [phone] Office: [phone]   Sandy Salaam Sherre Wooton 03/05/2019, 12:13 PM

## 2019-03-05 NOTE — Progress Notes (Signed)
Discharge paperwork and instructions given to pt. Pt not in distress and tolerated well. 

## 2019-07-19 ENCOUNTER — Telehealth: Payer: Self-pay | Admitting: Neurology

## 2019-07-19 MED ORDER — PHENYTOIN 50 MG PO CHEW: CHEWABLE_TABLET | ORAL | 0 refills | Status: DC

## 2019-07-19 MED ORDER — PHENYTOIN SODIUM EXTENDED 100 MG PO CAPS: 100.0000 mg | ORAL_CAPSULE | Freq: Every day | ORAL | 0 refills | Status: DC

## 2019-07-19 NOTE — Telephone Encounter (Signed)
Pt came by today wanting to reschedule a f/u appointment from January with Dr. Anne Hahn. Gave pt next avail with Sarah on 08/30, but pt would like to have his blood levels checked before then. Please advise, best call back # [phone].

## 2019-07-19 NOTE — Telephone Encounter (Signed)
I spoke to pt and he is doing ok, no seizures.  Has appt 09-20-19 but will need refills on dilantin caps and chewables prior to being seen .  I refilled for 90 days. He will keep appt. If needs labs prior to appt let me know and he will come in.

## 2019-07-20 NOTE — Telephone Encounter (Signed)
Noted  

## 2019-07-20 NOTE — Telephone Encounter (Signed)
He can wait and get labs done at his next follow-up appointment. Okay to go ahead and refill his meds.

## 2019-08-03 ENCOUNTER — Telehealth: Payer: Self-pay

## 2019-08-03 MED ORDER — LEVETIRACETAM 500 MG PO TABS: 500.0000 mg | ORAL_TABLET | Freq: Two times a day (BID) | ORAL | 0 refills | Status: DC

## 2019-08-03 NOTE — Addendum Note (Signed)
Addended by: Guy Begin on: 08/03/2019 04:59 PM   Modules accepted: Orders

## 2019-08-03 NOTE — Telephone Encounter (Signed)
Pt is needing a refill on his Levetiracetam 500mg  1 tablet BID. I confirmed med and pharmacy.

## 2019-09-20 ENCOUNTER — Ambulatory Visit: Payer: Medicare PPO | Admitting: Neurology

## 2019-09-20 ENCOUNTER — Encounter: Payer: Self-pay | Admitting: Neurology

## 2019-09-20 ENCOUNTER — Other Ambulatory Visit: Payer: Self-pay

## 2019-09-20 VITALS — BP 141/82 | HR 93 | Ht 71.0 in | Wt 154.0 lb

## 2019-09-20 DIAGNOSIS — G603 Idiopathic progressive neuropathy: Secondary | ICD-10-CM | POA: Diagnosis not present

## 2019-09-20 DIAGNOSIS — G40909 Epilepsy, unspecified, not intractable, without status epilepticus: Secondary | ICD-10-CM | POA: Diagnosis not present

## 2019-09-20 MED ORDER — LEVETIRACETAM 500 MG PO TABS: 500.0000 mg | ORAL_TABLET | Freq: Two times a day (BID) | ORAL | 4 refills | Status: DC

## 2019-09-20 MED ORDER — PHENYTOIN SODIUM EXTENDED 100 MG PO CAPS: 100.0000 mg | ORAL_CAPSULE | Freq: Every day | ORAL | 4 refills | Status: DC

## 2019-09-20 MED ORDER — PHENYTOIN 50 MG PO CHEW: CHEWABLE_TABLET | ORAL | 4 refills | Status: DC

## 2019-09-20 NOTE — Progress Notes (Signed)
I have read the note, and I agree with the clinical assessment and plan.  Shelah Heatley K Latresha Yahr   

## 2019-09-20 NOTE — Progress Notes (Signed)
PATIENT: Mario Waters DOB: 1940-02-25  REASON FOR VISIT: follow up HISTORY FROM: patient  HISTORY OF PRESENT ILLNESS: Today 09/20/19  Mario Waters is an 79 year old male with history of seizure disorder and peripheral neuropathy.  Last seizure was in April 2018.  He is on Keppra and Dilantin.  He suffered a left hip fracture in February, s/p hip pinning.  Is no longer taking Cymbalta, says was not helpful, is also on Lyrica, but does not know for what indication.  Neuropathy is not especially painful, does describe paresthesia, like a "worm" sensation in his feet up to his knees.  He walks daily down his driveway, uses quad cane.  No recent falls.  He sleeps well.  He drives a car, lives with his wife.  Presents today for evaluation unaccompanied.  HISTORY 07/13/2018 SS: Mario Waters is a 79 year old male with history of seizure disorder and peripheral neuropathy.  He has not had a seizure since April 2018.  He is well controlled on Keppra and Dilantin.  He reports that he has been doing well since his last visit.  He continues to complain of numbness in his lower extremities.  There is not any pain.  He reports that he sleeps well at night.  He denies any falls.  He says he does not use any assistive devices for ambulation.  He is able to drive a car without difficulty.  He is a very active 79 year old.  He is able to perform all of his ADLs.  In the past he has been on gabapentin for neuropathy, however this was switched to Cymbalta.  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  Today, he is interested in having his seizure levels checked.  He reports he is compliant with his medications.  He indicates his health has been well since his last visit.  He denies any new problems or concerns.  We did conduct a telephone visit, he was not able to perform a video visit.  REVIEW  OF SYSTEMS: Out of a complete 14 system review of symptoms, the patient complains only of the following symptoms, and all other reviewed systems are negative.  Seizures  ALLERGIES: No Known Allergies  HOME MEDICATIONS: Outpatient Medications Prior to Visit  Medication Sig Dispense Refill  . albuterol (PROVENTIL HFA;VENTOLIN HFA) 108 (90 Base) MCG/ACT inhaler Inhale 2 puffs into the lungs every 4 (four) hours as needed for wheezing or shortness of breath.     [name] atorvastatin (LIPITOR) 20 MG tablet Take 20 mg by mouth at bedtime.    . Cholecalciferol (VITAMIN D) 2000 units CAPS Take 2,000 Units by mouth daily.    . cyclobenzaprine (FLEXERIL) 5 MG tablet Take 5 mg by mouth 2 (two) times daily as needed for muscle spasms.    . diclofenac Sodium (VOLTAREN) 1 % GEL Apply 4 g topically 4 (four) times daily as needed (for joints).    [name] LYRICA 75 MG capsule Take 75 mg by mouth 2 (two) times daily.    . Omega-3 Fatty Acids (OMEGA-3 FISH OIL) 300 MG CAPS Take by mouth daily.    . DULoxetine (CYMBALTA) 30 MG capsule Take 30 mg by mouth daily.    [name] levETIRAcetam (KEPPRA) 500 MG tablet Take 1 tablet (500 mg total) by mouth 2 (two) times daily. 180 tablet 0  . phenytoin (DILANTIN) 100 MG ER capsule Take 1  capsule (100 mg total) by mouth at bedtime. 90 capsule 0  . phenytoin (PHENYTOIN INFATABS) 50 MG tablet CHEW AND SWALLOW 1 TABLET BY MOUTH AT BEDTIME 90 tablet 0  . acetaminophen (TYLENOL) 500 MG tablet Take 1,000 mg by mouth every 6 (six) hours as needed for mild pain.    [name] enoxaparin (LOVENOX) 40 MG/0.4ML injection Inject 0.4 mLs (40 mg total) into the skin daily. 13.6 mL 0  . Multiple Vitamins-Minerals (PRESERVISION AREDS 2) CAPS Take 2 capsules by mouth.    . pantoprazole (PROTONIX) 20 MG tablet Take 20 mg by mouth daily.    . DULoxetine (CYMBALTA) 30 MG capsule Take 1 capsule (30 mg total) by mouth at bedtime. 30 capsule 6   No facility-administered medications prior to visit.    PAST MEDICAL  HISTORY: Past Medical History:  Diagnosis Date  . Neuropathy   . Seizure disorder (HCC)    since 41s. taking Dilantin 200mg  nightly  . Seizures (HCC)     PAST SURGICAL HISTORY: Past Surgical History:  Procedure Laterality Date  . CATARACT EXTRACTION, BILATERAL    . HIP PINNING,CANNULATED Left 03/04/2019   Procedure: HIP PINNING;  Surgeon: 05/02/2019, MD;  Location: [facility] OR;  Service: Orthopedics;  Laterality: Left;  . KNEE SURGERY    . PENECTOMY    . REPAIR OF PERFORATED ULCER    . SPINE SURGERY     Cervical laminectomy  . TOE SURGERY      FAMILY HISTORY: Family History  Problem Relation Age of Onset  . Cancer Sister        breast  . Cancer Sister   . Seizures Neg Hx     SOCIAL HISTORY: Social History   Socioeconomic History  . Marital status: Married    Spouse name: Not on file  . Number of children: 4  . Years of education: 8+ home study  . Highest education level: Not on file  Occupational History  . Occupation: N/A  Tobacco Use  . Smoking status: Former Smoker    Quit date: 1980    Years since quitting: 41.6  . Smokeless tobacco: Never Used  Substance and Sexual Activity  . Alcohol use: No    Comment: Quit 1980  . Drug use: No  . Sexual activity: Not on file    Comment: Married  Other Topics Concern  . Not on file  Social History Narrative   Lives at home w/ his wife   Right-handed   Caffeine: 1 cup of coffee per day   Social Determinants of Health   Financial Resource Strain:   . Difficulty of Paying Living Expenses: Not on file  Food Insecurity:   . Worried About [facility] in the Last Year: Not on file  . Ran Out of Food in the Last Year: Not on file  Transportation Needs:   . Lack of Transportation (Medical): Not on file  . Lack of Transportation (Non-Medical): Not on file  Physical Activity:   . Days of Exercise per Week: Not on file  . Minutes of Exercise per Session: Not on file  Stress:   . Feeling of Stress : Not on  file  Social Connections:   . Frequency of Communication with Friends and Family: Not on file  . Frequency of Social Gatherings with Friends and Family: Not on file  . Attends Religious Services: Not on file  . Active Member of Clubs or Organizations: Not on file  . Attends Programme researcher, broadcasting/film/video Meetings:  Not on file  . Marital Status: Not on file  Intimate Partner Violence:   . Fear of Current or Ex-Partner: Not on file  . Emotionally Abused: Not on file  . Physically Abused: Not on file  . Sexually Abused: Not on file   PHYSICAL EXAM  Vitals:   09/20/19 0750  BP: (!) 141/82  Pulse: 93  Weight: 154 lb (69.9 kg)  Height: 5\' 11"  (1.803 m)   Body mass index is 21.48 kg/m.  Generalized: Well developed, in no acute distress  Neurological examination  Mentation: Alert oriented to time, place, history taking. Follows all commands speech and language fluent Cranial nerve II-XII: Pupils were equal round reactive to light. Extraocular movements were full, visual field were full on confrontational test. Facial sensation and strength were normal. Head turning and shoulder shrug  were normal and symmetric. Motor: No significant muscle weakness was noted, 4/5 left hip flexion Sensory: Sensation of soft touch was intact to legs, arms, and face, pinprick and vibration sensation were intact to lower extremities Coordination: Cerebellar testing reveals good finger-nose-finger and heel-to-shin bilaterally.  Gait and station: Gait is slightly wide-based, but steady, can walk in the room independently, uses quad cane in the hallway Reflexes: Deep tendon reflexes are symmetric and normal bilaterally.   DIAGNOSTIC DATA (LABS, IMAGING, TESTING) - I reviewed patient records, labs, notes, testing and imaging myself where available.  Lab Results  Component Value Date   WBC 7.5 03/05/2019   HGB 12.2 (L) 03/05/2019   HCT 35.5 (L) 03/05/2019   MCV 94.7 03/05/2019   PLT 164 03/05/2019      Component  Value Date/Time   NA 135 03/05/2019 0523   NA 144 07/13/2018 1332   K 3.3 (L) 03/05/2019 0523   CL 100 03/05/2019 0523   CO2 27 03/05/2019 0523   GLUCOSE 141 (H) 03/05/2019 0523   BUN 9 03/05/2019 0523   BUN 12 07/13/2018 1332   CREATININE 1.03 03/05/2019 0523   CREATININE 0.95 09/21/2015 1327   CALCIUM 7.8 (L) 03/05/2019 0523   PROT 7.3 07/13/2018 1332   ALBUMIN 4.4 07/13/2018 1332   AST 25 07/13/2018 1332   ALT 26 07/13/2018 1332   ALKPHOS 111 07/13/2018 1332   BILITOT 0.3 07/13/2018 1332   GFRNONAA >60 03/05/2019 0523   GFRAA >60 03/05/2019 0523   No results found for: CHOL, HDL, LDLCALC, LDLDIRECT, TRIG, CHOLHDL No results found for: 05/03/2019 Lab Results  Component Value Date   VITAMINB12 604 06/03/2016   No results found for: TSH   ASSESSMENT AND PLAN 79 y.o. year old male  has a past medical history of Neuropathy, Seizure disorder (HCC), and Seizures (HCC). here with:  1.  Seizure disorder -No recent seizures -We will check routine blood work today -Continue Keppra 500 mg twice daily -Continue Dilantin total 150 mg at bedtime (100 mg ER capsule, 50 mg chew tab)  2.  Peripheral neuropathy -Will discontinue Cymbalta, felt not helpful -Also on Lyrica, unclear for what indication from PCP -His Keppra and Dilantin may be helpful for neuropathy symptoms -Encouraged to continue activity as tolerated -Follow-up in 1 year or sooner if needed  I spent 30 minutes of face-to-face and non-face-to-face time with patient.  This included previsit chart review, lab review, study review, order entry, electronic health record documentation, patient education.  94, AGNP-C, DNP 09/20/2019, 9:19 AM [facility] [address] [place], [place] [phone]

## 2019-09-20 NOTE — Patient Instructions (Signed)
Continue current medications Check blood work today  Continue seeing your primary doctor Call for seizure  See you back in 1 year or sooner if needed

## 2019-09-22 LAB — CBC WITH DIFFERENTIAL/PLATELET
Basophils Absolute: 0.1 10*3/uL (ref 0.0–0.2)
Basos: 1 %
EOS (ABSOLUTE): 0.2 10*3/uL (ref 0.0–0.4)
Eos: 4 %
Hematocrit: 41.9 % (ref 37.5–51.0)
Hemoglobin: 14.7 g/dL (ref 13.0–17.7)
Immature Grans (Abs): 0 10*3/uL (ref 0.0–0.1)
Immature Granulocytes: 0 %
Lymphocytes Absolute: 2.5 10*3/uL (ref 0.7–3.1)
Lymphs: 48 %
MCH: 32.3 pg (ref 26.6–33.0)
MCHC: 35.1 g/dL (ref 31.5–35.7)
MCV: 92 fL (ref 79–97)
Monocytes Absolute: 0.4 10*3/uL (ref 0.1–0.9)
Monocytes: 8 %
Neutrophils Absolute: 2 10*3/uL (ref 1.4–7.0)
Neutrophils: 39 %
Platelets: 238 10*3/uL (ref 150–450)
RBC: 4.55 x10E6/uL (ref 4.14–5.80)
RDW: 12.7 % (ref 11.6–15.4)
WBC: 5.2 10*3/uL (ref 3.4–10.8)

## 2019-09-22 LAB — COMPREHENSIVE METABOLIC PANEL
ALT: 22 IU/L (ref 0–44)
AST: 24 IU/L (ref 0–40)
Albumin/Globulin Ratio: 1.6 (ref 1.2–2.2)
Albumin: 4.7 g/dL — ABNORMAL HIGH (ref 3.6–4.6)
Alkaline Phosphatase: 122 IU/L — ABNORMAL HIGH (ref 48–121)
BUN/Creatinine Ratio: 19 (ref 10–24)
BUN: 17 mg/dL (ref 8–27)
Bilirubin Total: 0.3 mg/dL (ref 0.0–1.2)
CO2: 28 mmol/L (ref 20–29)
Calcium: 9.1 mg/dL (ref 8.6–10.2)
Chloride: 99 mmol/L (ref 96–106)
Creatinine, Ser: 0.9 mg/dL (ref 0.76–1.27)
GFR calc Af Amer: 92 mL/min/{1.73_m2} (ref >59)
GFR calc non Af Amer: 80 mL/min/{1.73_m2} (ref >59)
Globulin, Total: 3 g/dL (ref 1.5–4.5)
Glucose: 107 mg/dL — ABNORMAL HIGH (ref 65–99)
Potassium: 4.7 mmol/L (ref 3.5–5.2)
Sodium: 140 mmol/L (ref 134–144)
Total Protein: 7.7 g/dL (ref 6.0–8.5)

## 2019-09-22 LAB — LEVETIRACETAM LEVEL: Levetiracetam Lvl: 8.1 ug/mL — ABNORMAL LOW (ref 10.0–40.0)

## 2019-09-22 LAB — PHENYTOIN LEVEL, TOTAL: Phenytoin (Dilantin), Serum: 17.7 ug/mL (ref 10.0–20.0)

## 2020-05-09 ENCOUNTER — Other Ambulatory Visit: Payer: Self-pay | Admitting: *Deleted

## 2020-05-09 MED ORDER — PHENYTOIN 50 MG PO CHEW: CHEWABLE_TABLET | ORAL | 4 refills | Status: DC

## 2020-05-09 MED ORDER — PHENYTOIN SODIUM EXTENDED 100 MG PO CAPS: 100.0000 mg | ORAL_CAPSULE | Freq: Every day | ORAL | 4 refills | Status: DC

## 2020-09-19 ENCOUNTER — Encounter: Payer: Self-pay | Admitting: Neurology

## 2020-09-19 ENCOUNTER — Ambulatory Visit: Payer: Medicare PPO | Admitting: Neurology

## 2020-09-19 ENCOUNTER — Other Ambulatory Visit: Payer: Self-pay

## 2020-09-19 VITALS — BP 114/67 | HR 78 | Ht 71.0 in | Wt 153.2 lb

## 2020-09-19 DIAGNOSIS — G40909 Epilepsy, unspecified, not intractable, without status epilepticus: Secondary | ICD-10-CM

## 2020-09-19 DIAGNOSIS — Z5181 Encounter for therapeutic drug level monitoring: Secondary | ICD-10-CM

## 2020-09-19 DIAGNOSIS — M25561 Pain in right knee: Secondary | ICD-10-CM | POA: Diagnosis not present

## 2020-09-19 DIAGNOSIS — G8929 Other chronic pain: Secondary | ICD-10-CM

## 2020-09-19 DIAGNOSIS — G603 Idiopathic progressive neuropathy: Secondary | ICD-10-CM

## 2020-09-19 MED ORDER — LEVETIRACETAM 500 MG PO TABS: 500.0000 mg | ORAL_TABLET | Freq: Two times a day (BID) | ORAL | 3 refills | Status: DC

## 2020-09-19 NOTE — Progress Notes (Signed)
Reason for visit: Seizures, peripheral neuropathy, right knee pain  Mario Waters is an 80 y.o. male  History of present illness:  Mario Waters is an 80 year old right-handed black male with a history of seizures.  The seizures have been under good control with Dilantin and Keppra combination.  He reports a problem with peripheral neuropathy with numbness up to the knees bilaterally.  He has a prior history of cervical spine surgery in the past, he had numbness in the hands and feet prior to surgery, his hand numbness has improved after surgery.  He has a chronic gait disorder, he fell and February 2021 fracturing the left hip, but he has not had any further falls.  He uses a cane when he is outside of the house.  He reports a lot of discomfort in the right knee, around 1 July he had significant swelling of the knee and has pain with weightbearing.  He has not yet seen anybody about the knee issue.  He has not had a seizure in many years.  He denies issues controlling the bowels or the bladder.  He has no pain from his peripheral neuropathy.  Past Medical History:  Diagnosis Date   Hip fracture (HCC) 02/2019   Neuropathy    Seizure disorder (HCC)    since 1990s. taking Dilantin 200mg  nightly   Seizures [facility])     Past Surgical History:  Procedure Laterality Date   CATARACT EXTRACTION, BILATERAL     HIP PINNING,CANNULATED Left 03/04/2019   Procedure: HIP PINNING;  Surgeon: 05/02/2019, MD;  Location: [facility] OR;  Service: Orthopedics;  Laterality: Left;   KNEE SURGERY     PENECTOMY     REPAIR OF PERFORATED ULCER     SPINE SURGERY     Cervical laminectomy   TOE SURGERY      Family History  Problem Relation Age of Onset   Cancer Sister        breast   Cancer Sister    Seizures Neg Hx     Social history:  reports that he quit smoking about 42 years ago. He has never used smokeless tobacco. He reports that he does not drink alcohol and does not use drugs.   No Known  Allergies  Medications:  Prior to Admission medications   Medication Sig Start Date End Date Taking? Authorizing Provider  acetaminophen (TYLENOL) 500 MG tablet Take 500 mg by mouth every 6 (six) hours as needed.   Yes [provider]  alfuzosin (UROXATRAL) 10 MG 24 hr tablet alfuzosin ER 10 mg tablet,extended release 24 hr  TAKE 1 TABLET BY MOUTH EVERY DAY   Yes [provider]  atorvastatin (LIPITOR) 20 MG tablet Take 20 mg by mouth at bedtime. 12/14/18  Yes [provider]  budesonide-formoterol (SYMBICORT) 80-4.5 MCG/ACT inhaler Inhale 2 puffs into the lungs 2 (two) times daily.   Yes [provider]  Cholecalciferol (VITAMIN D) 2000 units CAPS Take 2,000 Units by mouth daily.   Yes [provider]  clonazePAM (KLONOPIN) 0.5 MG tablet clonazepam 0.5 mg tablet  TAKE 1 TABLET BY MOUTH AT BEDTIME AS NEEDED   Yes [provider]  cyclobenzaprine (FLEXERIL) 5 MG tablet Take 5 mg by mouth 2 (two) times daily as needed for muscle spasms.   Yes [provider]  levETIRAcetam (KEPPRA) 500 MG tablet Take 1 tablet (500 mg total) by mouth 2 (two) times daily. 09/20/19  Yes 09/22/19, NP  Multiple Vitamins-Minerals (PRESERVISION AREDS  2) CAPS Take 2 capsules by mouth.   Yes [provider]  phenytoin (DILANTIN) 100 MG ER capsule Take 1 capsule (100 mg total) by mouth at bedtime. 05/09/20  Yes Glean Salvo, NP  phenytoin (PHENYTOIN INFATABS) 50 MG tablet CHEW AND SWALLOW 1 TABLET BY MOUTH AT BEDTIME 05/09/20  Yes Glean Salvo, NP  albuterol (PROVENTIL HFA;VENTOLIN HFA) 108 (90 Base) MCG/ACT inhaler Inhale 2 puffs into the lungs every 4 (four) hours as needed for wheezing or shortness of breath.  Patient not taking: Reported on 09/19/2020 04/27/16   [provider]  diclofenac Sodium (VOLTAREN) 1 % GEL Apply 4 g topically 4 (four) times daily as needed (for joints). Patient not taking: Reported on 09/19/2020    [provider]  enoxaparin (LOVENOX) 40 MG/0.4ML injection Inject 0.4 mLs (40 mg total) into the skin daily. 03/06/19 04/09/19  Chundi, Sherlyn Lees, MD  LYRICA 75 MG capsule Take 75 mg by mouth 2 (two) times daily. Patient not taking: Reported on 09/19/2020 10/08/18   [provider]  pantoprazole (PROTONIX) 20 MG tablet Take 20 mg by mouth daily. Patient not taking: Reported on 09/19/2020    [provider]    ROS:  Out of a complete 14 system review of symptoms, the patient complains only of the following symptoms, and all other reviewed systems are negative.  Right knee pain Balance problems Numbness in the feet  Blood pressure 114/67, pulse 78, height 5\' 11"  (1.803 m), weight 153 lb 3.2 oz (69.5 kg).  Physical Exam  General: The patient is alert and cooperative at the time of the examination.  Skin: No significant peripheral edema is noted.   Neurologic Exam  Mental status: The patient is alert and oriented x 3 at the time of the examination. The patient has apparent normal recent and remote memory, with an apparently normal attention span and concentration ability.   Cranial nerves: Facial symmetry is present. Speech is normal, no aphasia or dysarthria is noted. Extraocular movements are full. Visual fields are full.  Motor: The patient has good strength in all 4 extremities, with exception of some weakness of intrinsic muscles of the hands bilaterally.  Sensory examination: Soft touch sensation is symmetric on the face, arms, and legs.  Coordination: The patient has good finger-nose-finger bilaterally.  The patient has apraxia with the lower extremities when performing heel-to-shin bilaterally.  Gait and station: The patient has a slightly wide-based gait, the patient walk independently but usually uses a cane.  Tandem gait was not attempted.  Romberg is negative but is slightly unsteady.  Reflexes: Deep tendon reflexes are symmetric, but are  depressed.   Assessment/Plan:  1.  History of seizures, well controlled  2.  History of peripheral neuropathy  3.  Gait disturbance  4.  Right knee pain, arthritis  The patient will be referred to orthopedic surgery for evaluation of his right knee pain.  He will have blood work done today.  A prescription for the Keppra was sent in.  The we will follow-up here in 1 year.  In the future, he can be seen through Dr. .  Teresa Coombs MD 09/19/2020 8:42 AM  [facility] [address] [place], [place]  Phone [phone] Fax [phone]

## 2020-09-22 LAB — CBC WITH DIFFERENTIAL/PLATELET
Basophils Absolute: 0 10*3/uL (ref 0.0–0.2)
Basos: 1 %
EOS (ABSOLUTE): 0.2 10*3/uL (ref 0.0–0.4)
Eos: 3 %
Hematocrit: 43.3 % (ref 37.5–51.0)
Hemoglobin: 14.7 g/dL (ref 13.0–17.7)
Immature Grans (Abs): 0 10*3/uL (ref 0.0–0.1)
Immature Granulocytes: 0 %
Lymphocytes Absolute: 2.5 10*3/uL (ref 0.7–3.1)
Lymphs: 42 %
MCH: 32.7 pg (ref 26.6–33.0)
MCHC: 33.9 g/dL (ref 31.5–35.7)
MCV: 96 fL (ref 79–97)
Monocytes Absolute: 0.5 10*3/uL (ref 0.1–0.9)
Monocytes: 9 %
Neutrophils Absolute: 2.8 10*3/uL (ref 1.4–7.0)
Neutrophils: 45 %
Platelets: 239 10*3/uL (ref 150–450)
RBC: 4.49 x10E6/uL (ref 4.14–5.80)
RDW: 12 % (ref 11.6–15.4)
WBC: 6 10*3/uL (ref 3.4–10.8)

## 2020-09-22 LAB — COMPREHENSIVE METABOLIC PANEL
ALT: 25 IU/L (ref 0–44)
AST: 26 IU/L (ref 0–40)
Albumin/Globulin Ratio: 1.7 (ref 1.2–2.2)
Albumin: 4.8 g/dL — ABNORMAL HIGH (ref 3.6–4.6)
Alkaline Phosphatase: 142 IU/L — ABNORMAL HIGH (ref 44–121)
BUN/Creatinine Ratio: 11 (ref 10–24)
BUN: 11 mg/dL (ref 8–27)
Bilirubin Total: <0.2 mg/dL (ref 0.0–1.2)
CO2: 26 mmol/L (ref 20–29)
Calcium: 9.2 mg/dL (ref 8.6–10.2)
Chloride: 102 mmol/L (ref 96–106)
Creatinine, Ser: 1 mg/dL (ref 0.76–1.27)
Globulin, Total: 2.9 g/dL (ref 1.5–4.5)
Glucose: 86 mg/dL (ref 65–99)
Potassium: 4.9 mmol/L (ref 3.5–5.2)
Sodium: 141 mmol/L (ref 134–144)
Total Protein: 7.7 g/dL (ref 6.0–8.5)
eGFR: 75 mL/min/{1.73_m2} (ref >59)

## 2020-09-22 LAB — PHENYTOIN LEVEL, TOTAL: Phenytoin (Dilantin), Serum: 19.2 ug/mL (ref 10.0–20.0)

## 2020-09-22 LAB — LEVETIRACETAM LEVEL: Levetiracetam Lvl: 14.8 ug/mL (ref 10.0–40.0)

## 2020-09-26 ENCOUNTER — Telehealth: Payer: Self-pay

## 2020-09-26 NOTE — Telephone Encounter (Signed)
I called pt. No answer, left a message asking pt to call me back.   

## 2020-09-26 NOTE — Telephone Encounter (Signed)
-----   Message from York Spaniel, MD sent at 09/25/2020 12:07 PM EDT ----- Blood work is unremarkable exception of a slightly elevated albumin level which is likely of no clinical significance.  The alkaline phosphatase level slightly elevated, likely related to the use of Dilantin, again of no clinical significance.  CBC is unremarkable.  Dilantin and Keppra levels are therapeutic.  No change in medical therapy is recommended. Please call the patient. ----- Message ----- From: Nell Range Lab Results In Sent: 09/20/2020   7:38 AM EDT To: York Spaniel, MD

## 2020-09-27 NOTE — Telephone Encounter (Signed)
Attempted to reach the pt for the second time. Received a message stating he was not available to take the call and to try again later. No vm option at the time of the call.

## 2020-09-29 ENCOUNTER — Ambulatory Visit: Payer: Medicare PPO | Admitting: Surgical

## 2020-10-02 NOTE — Telephone Encounter (Signed)
I called the pt and advised or results. He verbalized understanding. Pt had not questions/concerns at the time of the call.

## 2020-10-03 ENCOUNTER — Ambulatory Visit (INDEPENDENT_AMBULATORY_CARE_PROVIDER_SITE_OTHER): Payer: Medicare PPO

## 2020-10-03 ENCOUNTER — Encounter: Payer: Self-pay | Admitting: Orthopaedic Surgery

## 2020-10-03 ENCOUNTER — Ambulatory Visit: Payer: Medicare PPO | Admitting: Orthopaedic Surgery

## 2020-10-03 DIAGNOSIS — M1711 Unilateral primary osteoarthritis, right knee: Secondary | ICD-10-CM

## 2020-10-03 MED ORDER — LIDOCAINE HCL 1 % IJ SOLN
2.0000 mL | INTRAMUSCULAR | Status: AC | PRN
  Administered 2020-10-03: 2 mL

## 2020-10-03 MED ORDER — METHYLPREDNISOLONE ACETATE 40 MG/ML IJ SUSP
40.0000 mg | INTRAMUSCULAR | Status: AC | PRN
  Administered 2020-10-03: 40 mg via INTRA_ARTICULAR

## 2020-10-03 MED ORDER — BUPIVACAINE HCL 0.25 % IJ SOLN
2.0000 mL | INTRAMUSCULAR | Status: AC | PRN
  Administered 2020-10-03: 2 mL via INTRA_ARTICULAR

## 2020-10-03 NOTE — Progress Notes (Signed)
Office Visit Note   Patient: Mario Waters           Date of Birth: 25-Jan-1940           MRN: [id number] Visit Date: 10/03/2020              Requested by: York Spaniel, MD [address] [place],  [place] [address] PCP: Maretta Bees, [place]   Assessment & Plan: Visit Diagnoses:  1. Primary osteoarthritis of right knee     Plan: Impression is right knee osteoarthritis and chronic popliteal fossa and calf pain.  It is hard to tell whether the pain in his popliteal fossa and calf are being referred from his knee, but I feel as though it is likely coming from his knee and not a DVT. I would like to go ahead and order a venous Doppler ultrasound to rule out DVT.  Should he develop any chest pain or shortness of breath he has been instructed to go to the ED.  We have also discussed proceeding with right knee cortisone injection for which she would like to proceed.  He will follow-up with [place] as needed.  Follow-Up Instructions: Return if symptoms worsen or fail to improve.   Orders:  Orders Placed This Encounter  Procedures   Large Joint Inj: R knee   XR KNEE 3 VIEW RIGHT   No orders of the defined types were placed in this encounter.     Procedures: Large Joint Inj: R knee on 10/03/2020 11:34 AM Indications: pain Details: 22 G needle, anterolateral approach Medications: 2 mL lidocaine 1 %; 2 mL bupivacaine 0.25 %; 40 mg methylPREDNISolone acetate 40 MG/ML     Clinical Data: No additional findings.   Subjective: Chief Complaint  Patient presents with   Right Knee - Pain    HPI patient is a pleasant 80 year old gentleman who comes in today with right knee pain for the past 2 months which is progressively worsened.  He denies any injury or change in activity.  The pain he has is primarily to the popliteal fossa and in the calf.  He does have associated swelling.  Pain is worse with flexion of the knee.  He has been taking a muscle relaxer without relief of  symptoms.  No previous cortisone injection or ultrasound right lower extremity that he is aware of.  No chest pain or shortness of breath.  Review of Systems as detailed in HPI.  All others reviewed and are negative.   Objective: Vital Signs: There were no vitals taken for this visit.  Physical Exam well-developed well-nourished gentleman in no acute distress.  Alert and oriented x3.  Ortho Exam right knee exam shows a trace effusion.  Range of motion 0 to 95 degrees.  He does have moderate lateral joint line tenderness.  Mild to moderate tenderness to popliteal fossa and into the calf.  Calf soft nontender.  Negative Homans.    Specialty Comments:  No specialty comments available.  Imaging: XR KNEE 3 VIEW RIGHT  Result Date: 10/03/2020 Moderate degenerative changes to the medial and patellofemoral compartments    PMFS History: Patient Active Problem List   Diagnosis Date Noted   Osteoporosis 03/04/2019   Fracture of femoral neck, left, closed (HCC) 03/03/2019   Benign prostatic hyperplasia 12/08/2017   Primary osteoarthritis of both knees 06/26/2016   Peripheral neuropathy 09/21/2015   Seizure disorder (HCC) 09/21/2015   Past Medical History:  Diagnosis Date   Hip fracture (HCC) 02/2019  Neuropathy    Seizure disorder (HCC)    since 1990s. taking Dilantin 200mg  nightly   Seizures (HCC)     Family History  Problem Relation Age of Onset   Cancer Sister        breast   Cancer Sister    Seizures Neg Hx     Past Surgical History:  Procedure Laterality Date   CATARACT EXTRACTION, BILATERAL     HIP PINNING,CANNULATED Left 03/04/2019   Procedure: HIP PINNING;  Surgeon: 05/02/2019, MD;  Location: [facility] OR;  Service: Orthopedics;  Laterality: Left;   KNEE SURGERY     PENECTOMY     REPAIR OF PERFORATED ULCER     SPINE SURGERY     Cervical laminectomy   TOE SURGERY     Social History   Occupational History   Occupation: N/A  Tobacco Use   Smoking status: Former     Types: Cigarettes    Quit date: 1980    Years since quitting: 42.7   Smokeless tobacco: Never  Substance and Sexual Activity   Alcohol use: No    Comment: Quit 1980   Drug use: No   Sexual activity: Not on file    Comment: Married

## 2020-10-04 ENCOUNTER — Ambulatory Visit (HOSPITAL_COMMUNITY)
Admission: RE | Admit: 2020-10-04 | Discharge: 2020-10-04 | Disposition: A | Discharge status: Home / Self Care | Payer: Medicare PPO | Source: Ambulatory Visit | Attending: Orthopaedic Surgery | Admitting: Orthopaedic Surgery

## 2020-10-04 ENCOUNTER — Other Ambulatory Visit: Payer: Self-pay

## 2020-10-04 ENCOUNTER — Telehealth: Payer: Self-pay | Admitting: *Deleted

## 2020-10-04 ENCOUNTER — Telehealth: Payer: Self-pay

## 2020-10-04 DIAGNOSIS — M1711 Unilateral primary osteoarthritis, right knee: Secondary | ICD-10-CM

## 2020-10-04 NOTE — Progress Notes (Signed)
Right lower extremity venous duplex has been completed. Preliminary results can be found in CV Proc through chart review.  Results were given to April at Dr. Warren Danes office.  10/04/20 9:48 AM Olen Cordial RVT

## 2020-10-04 NOTE — Telephone Encounter (Signed)
Pt is scheduled today at [facility] at 10am per Lupita Leash with Heart and vascular at [facility].

## 2020-10-04 NOTE — Telephone Encounter (Signed)
Per Tammy Sours with [facility], patient is Negative for DVT, Right LE.

## 2020-10-22 IMAGING — RF DG HIP (WITH PELVIS) OPERATIVE*L*
1 series · 2 of 2 positions shown · non-contrast
Comparison: 03/03/2019

CLINICAL DATA: Operative imaging for ORIF of a subcapital left
femoral neck fracture.

EXAM:
OPERATIVE LEFT HIP (WITH PELVIS IF PERFORMED) 2 VIEWS
TECHNIQUE: Fluoroscopic spot image(s) were submitted for interpretation
post-operatively.

[Series 1: run · 2 of 2 slices shown]
[im 1/2]
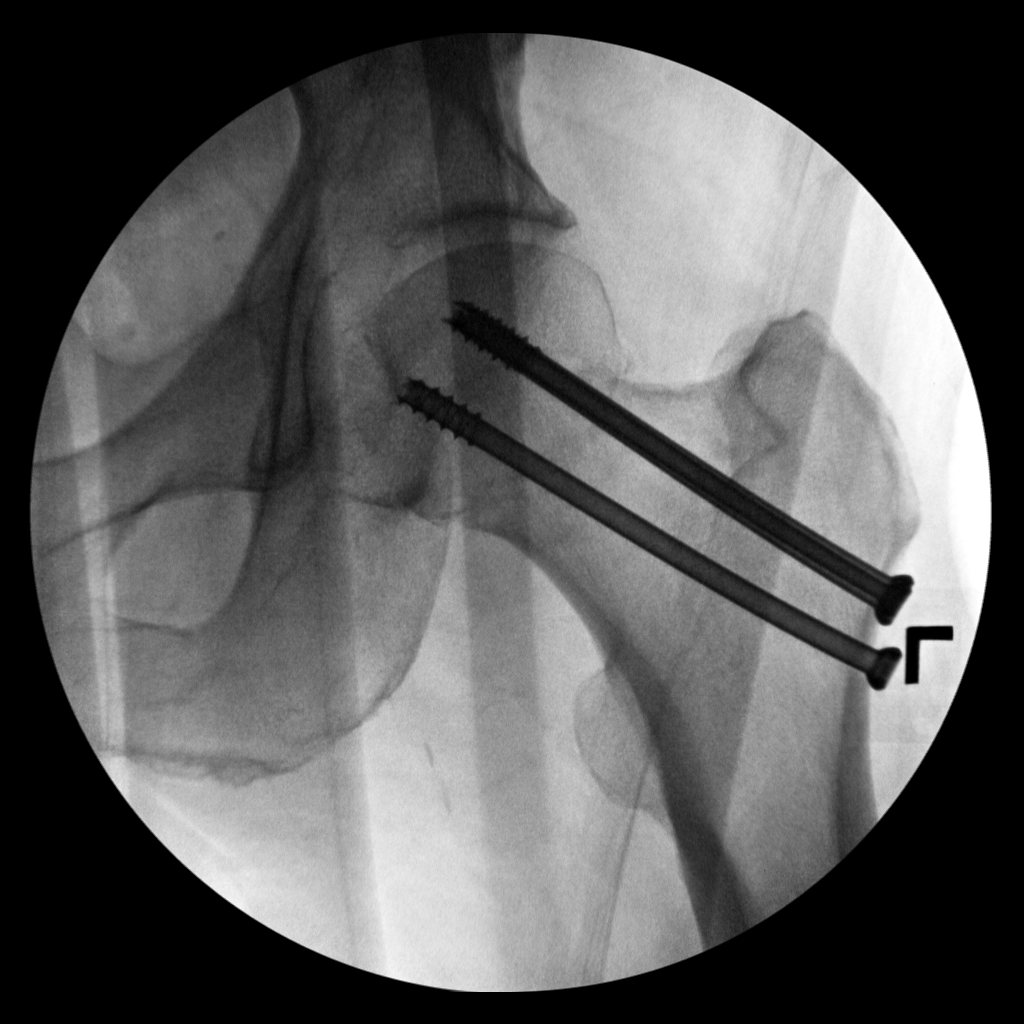
[im 2/2]
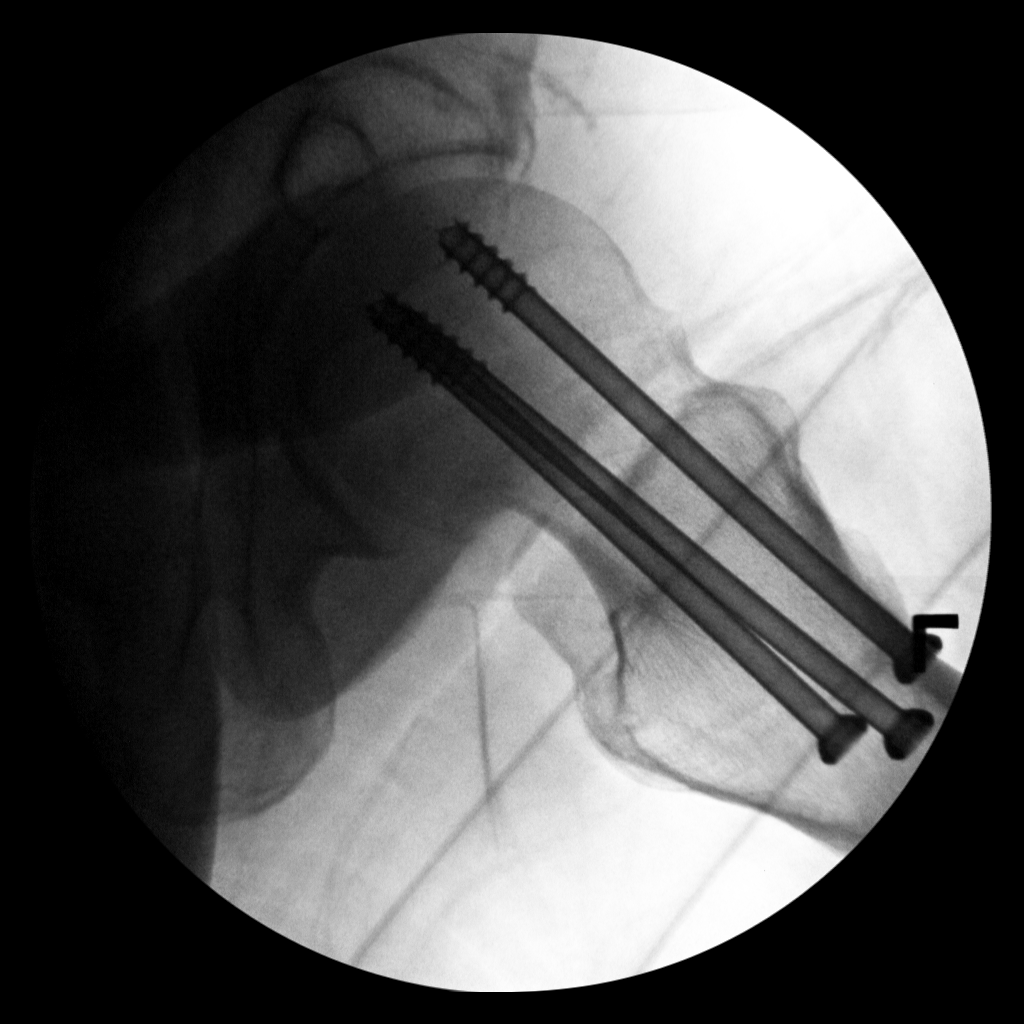

[2 of 2 positions shown; findings below may reference images not displayed]

FINDINGS: Portable operative images show placement of 3 screws across the
femoral neck into the femoral head, fixating the subcapital femoral
neck fracture, without displacement, with mild persistent valgus
angulation. The orthopedic hardware appears well seated. No evidence
of an operative complication.
IMPRESSION: Imaging provided for ORIF of a subcapital left femoral neck
fracture.

## 2020-10-22 IMAGING — DX DG HIP (WITH OR WITHOUT PELVIS) 1V*L*
2 series · 2 of 2 positions shown · non-contrast
Comparison: Fluoroscopic images of same day.

CLINICAL DATA: Status post open reduction internal fixation of left
hip fracture.

EXAM:
DG HIP (WITH OR WITHOUT PELVIS) 1V*L*

[pelvis ap]
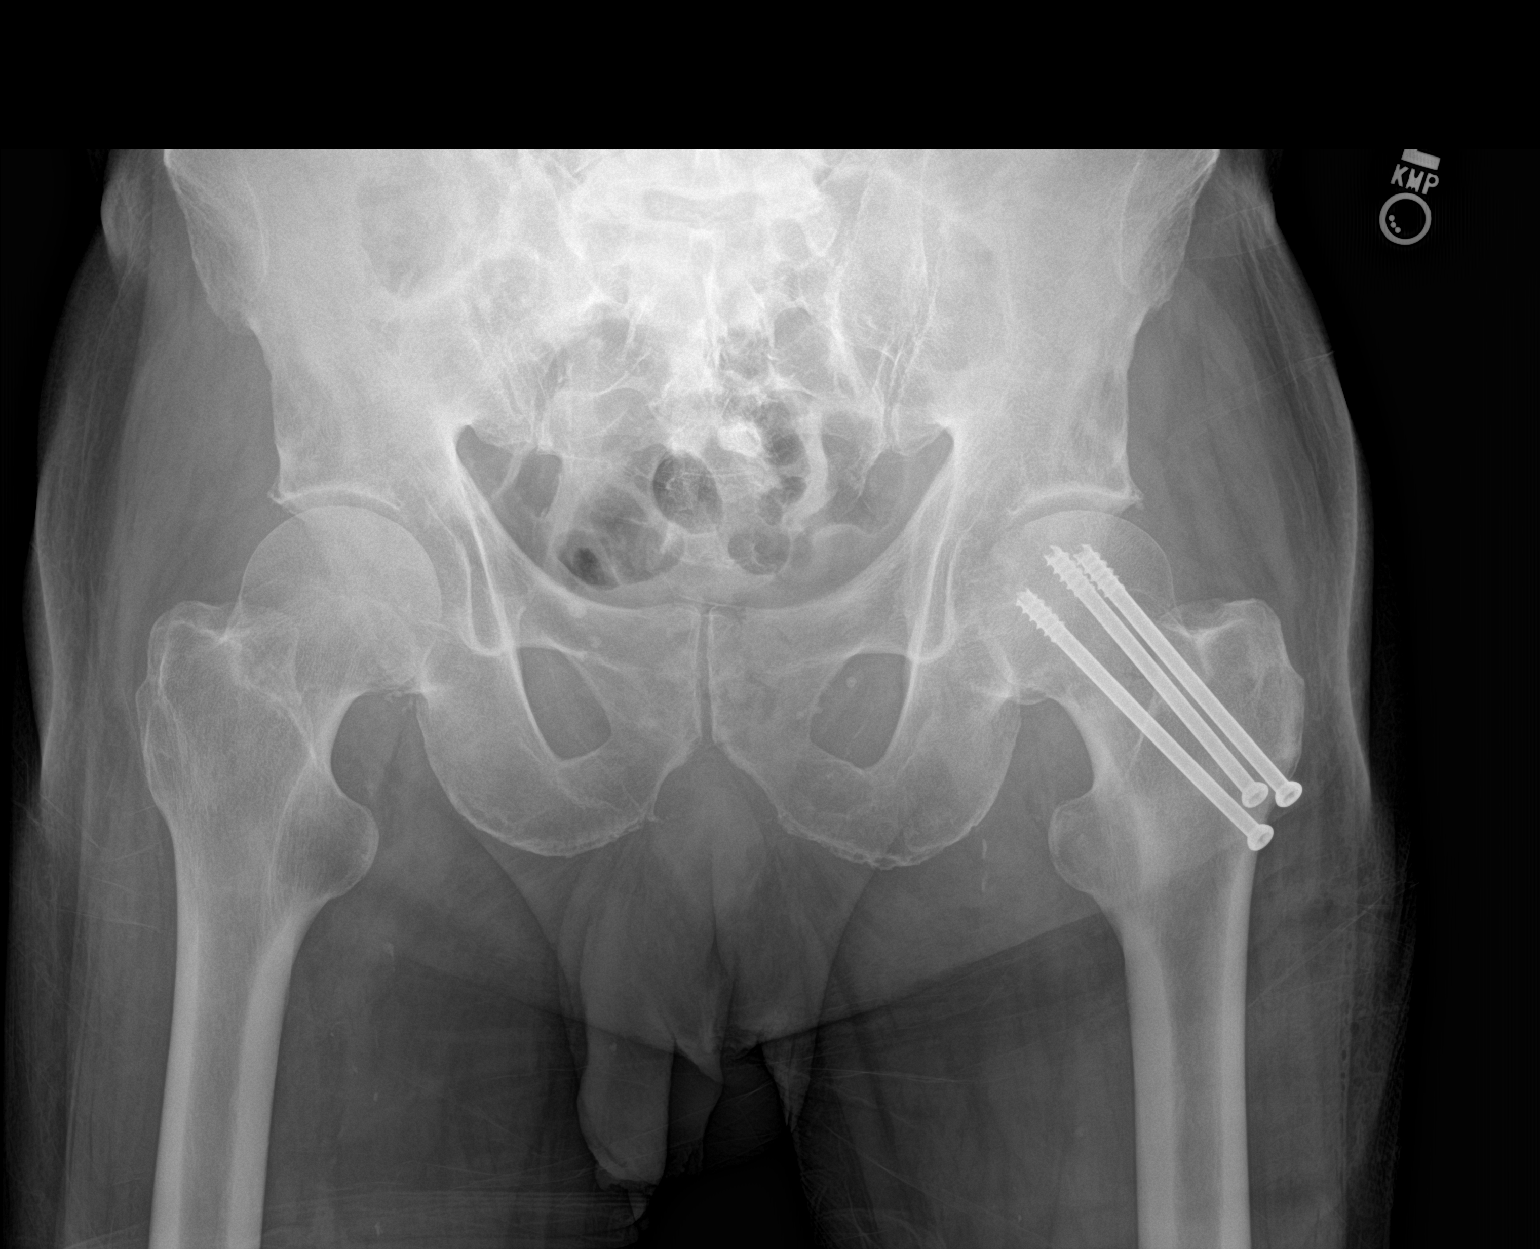

[hip lat]
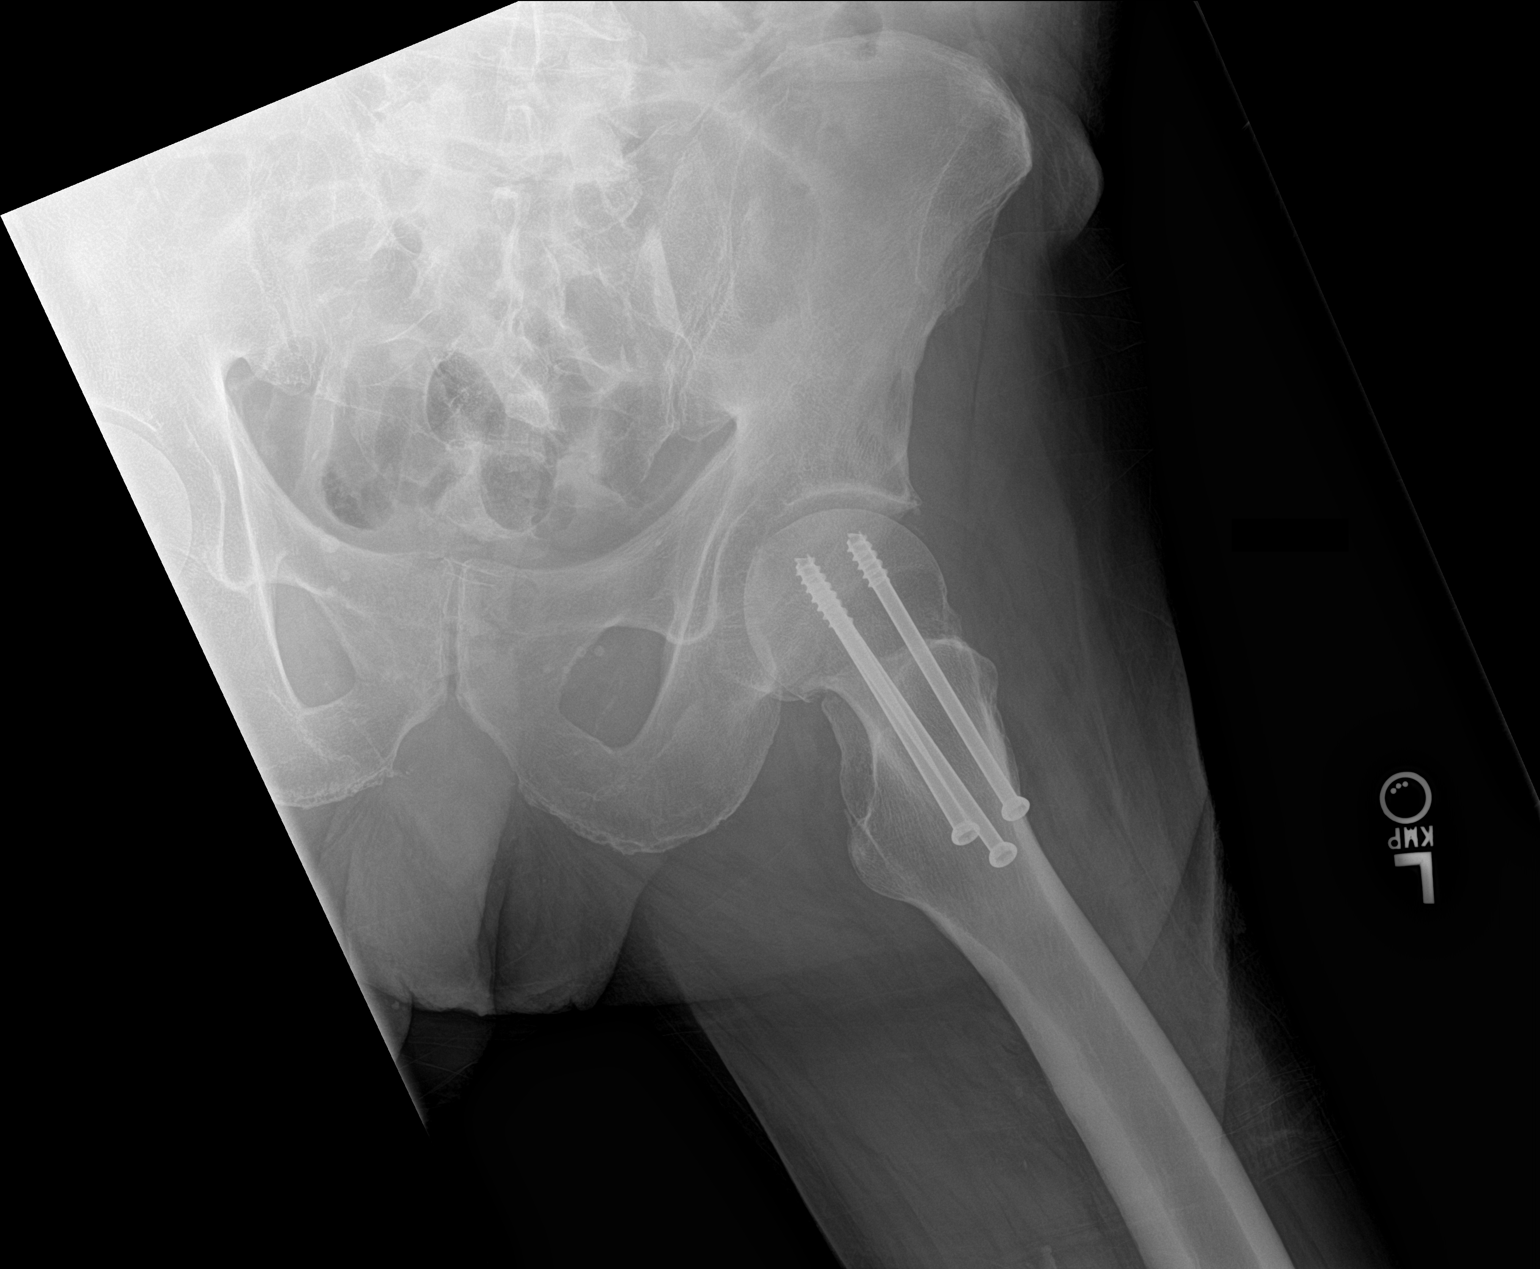

[2 of 2 positions shown; findings below may reference images not displayed]

FINDINGS: Patient is status post surgical fixation proximal left femoral
fracture. No acute fracture or dislocation is noted.
IMPRESSION: Status post surgical fixation of proximal left femoral fracture.

## 2020-12-04 ENCOUNTER — Telehealth: Payer: Self-pay | Admitting: Neurology

## 2020-12-04 NOTE — Telephone Encounter (Signed)
Dr. Anne Hahn sent a 90-day rx with 3 refills on 09/19/20. Patient just needs to contact the pharmacy to request.

## 2020-12-04 NOTE — Telephone Encounter (Signed)
Pt has called for a refill on his  levETIRAcetam (KEPPRA) 500 MG tablet [facility] [phone]

## 2020-12-04 NOTE — Telephone Encounter (Signed)
LVM  If pt calls back please let him know that he should have plenty of refills at pharmacy.

## 2021-06-02 ENCOUNTER — Other Ambulatory Visit: Payer: Self-pay

## 2021-06-02 ENCOUNTER — Emergency Department (HOSPITAL_COMMUNITY)
Admission: EM | Admit: 2021-06-02 | Discharge: 2021-06-02 | Disposition: A | Discharge status: Home / Self Care | Payer: Medicare PPO | Attending: Emergency Medicine | Admitting: Emergency Medicine

## 2021-06-02 DIAGNOSIS — Z803 Family history of malignant neoplasm of breast: Secondary | ICD-10-CM | POA: Diagnosis not present

## 2021-06-02 DIAGNOSIS — G40909 Epilepsy, unspecified, not intractable, without status epilepticus: Secondary | ICD-10-CM | POA: Diagnosis not present

## 2021-06-02 DIAGNOSIS — N6342 Unspecified lump in left breast, subareolar: Secondary | ICD-10-CM | POA: Insufficient documentation

## 2021-06-02 DIAGNOSIS — N6320 Unspecified lump in the left breast, unspecified quadrant: Secondary | ICD-10-CM | POA: Insufficient documentation

## 2021-06-02 DIAGNOSIS — N644 Mastodynia: Secondary | ICD-10-CM | POA: Diagnosis not present

## 2021-06-02 LAB — CBC WITH DIFFERENTIAL/PLATELET
Abs Immature Granulocytes: 0.01 10*3/uL (ref 0.00–0.07)
Basophils Absolute: 0 10*3/uL (ref 0.0–0.1)
Basophils Relative: 1 %
Eosinophils Absolute: 0.2 10*3/uL (ref 0.0–0.5)
Eosinophils Relative: 4 %
HCT: 43.1 % (ref 39.0–52.0)
Hemoglobin: 14.2 g/dL (ref 13.0–17.0)
Immature Granulocytes: 0 %
Lymphocytes Relative: 48 %
Lymphs Abs: 2.2 10*3/uL (ref 0.7–4.0)
MCH: 32.3 pg (ref 26.0–34.0)
MCHC: 32.9 g/dL (ref 30.0–36.0)
MCV: 98.2 fL (ref 80.0–100.0)
Monocytes Absolute: 0.4 10*3/uL (ref 0.1–1.0)
Monocytes Relative: 9 %
Neutro Abs: 1.7 10*3/uL (ref 1.7–7.7)
Neutrophils Relative %: 38 %
Platelets: 217 10*3/uL (ref 150–400)
RBC: 4.39 MIL/uL (ref 4.22–5.81)
RDW: 13.1 % (ref 11.5–15.5)
WBC: 4.6 10*3/uL (ref 4.0–10.5)
nRBC: 0 % (ref 0.0–0.2)

## 2021-06-02 LAB — COMPREHENSIVE METABOLIC PANEL
ALT: 25 U/L (ref 0–44)
AST: 28 U/L (ref 15–41)
Albumin: 4.3 g/dL (ref 3.5–5.0)
Alkaline Phosphatase: 79 U/L (ref 38–126)
Anion gap: 7 (ref 5–15)
BUN: 13 mg/dL (ref 8–23)
CO2: 29 mmol/L (ref 22–32)
Calcium: 9.1 mg/dL (ref 8.9–10.3)
Chloride: 101 mmol/L (ref 98–111)
Creatinine, Ser: 1 mg/dL (ref 0.61–1.24)
GFR, Estimated: >60 mL/min (ref >60)
Glucose, Bld: 120 mg/dL — ABNORMAL HIGH (ref 70–99)
Potassium: 5.3 mmol/L — ABNORMAL HIGH (ref 3.5–5.1)
Sodium: 137 mmol/L (ref 135–145)
Total Bilirubin: 0.6 mg/dL (ref 0.3–1.2)
Total Protein: 7.3 g/dL (ref 6.5–8.1)

## 2021-06-02 NOTE — ED Provider Notes (Signed)
?[facility] EMERGENCY DEPARTMENT ?Provider Note ? ? ?CSN: [id number] ?Arrival date & time: 06/02/21  5681 ? ?  ? ?History ? ?Chief Complaint  ?Patient presents with  ? knot on chest  ? ? ?Mario Waters is a 81 y.o. male. ? ?HPI ? ?  ?81yo male with history of seizure disorder presents with concern for left breast mass. Reports he has knot under his nipple of left chest.  It has been there for one week, tenderness when he touches the area. PCP gave him rx for naproxen.  No fever, nausea, vomiting, lightheadedness.  Approx 3cm area.  Does have history of breast cancer in mother and sister.  ?Reports his dr referred him for imaging but they did not accept his insurance. ? ?Past Medical History:  ?Diagnosis Date  ? Hip fracture (HCC) 02/2019  ? Neuropathy   ? Seizure disorder (HCC)   ? since 1990s. taking Dilantin 200mg  nightly  ? Seizures (HCC)   ? ? ? ?Home Medications ?Prior to Admission medications   ?Medication Sig Start Date End Date Taking? Authorizing Provider  ?acetaminophen (TYLENOL) 500 MG tablet Take 500 mg by mouth every 6 (six) hours as needed.    [provider]  ?alfuzosin (UROXATRAL) 10 MG 24 hr tablet alfuzosin ER 10 mg tablet,extended release 24 hr ? TAKE 1 TABLET BY MOUTH EVERY DAY    [provider]  ?atorvastatin (LIPITOR) 20 MG tablet Take 20 mg by mouth at bedtime. 12/14/18   [provider]  ?budesonide-formoterol (SYMBICORT) 80-4.5 MCG/ACT inhaler Inhale 2 puffs into the lungs 2 (two) times daily.    [provider]  ?Cholecalciferol (VITAMIN D) 2000 units CAPS Take 2,000 Units by mouth daily.    [provider]  ?clonazePAM (KLONOPIN) 0.5 MG tablet clonazepam 0.5 mg tablet ? TAKE 1 TABLET BY MOUTH AT BEDTIME AS NEEDED    [provider]  ?cyclobenzaprine (FLEXERIL) 5 MG tablet Take 5 mg by mouth 2 (two) times daily as needed for muscle spasms.    [provider]  ?enoxaparin (LOVENOX) 40 MG/0.4ML injection Inject  0.4 mLs (40 mg total) into the skin daily. 03/06/19 04/09/19  04/11/19, MD  ?levETIRAcetam (KEPPRA) 500 MG tablet Take 1 tablet (500 mg total) by mouth 2 (two) times daily. 09/19/20   09/21/20, MD  ?Multiple Vitamins-Minerals (PRESERVISION AREDS 2) CAPS Take 2 capsules by mouth.    [provider]  ?phenytoin (DILANTIN) 100 MG ER capsule Take 1 capsule (100 mg total) by mouth at bedtime. 05/09/20   05/11/20, NP  ?phenytoin (PHENYTOIN INFATABS) 50 MG tablet CHEW AND SWALLOW 1 TABLET BY MOUTH AT BEDTIME 05/09/20   05/11/20, NP  ?   ? ?Allergies    ?Patient has no known allergies.   ? ?Review of Systems   ?Review of Systems ? ?Physical Exam ?Updated Vital Signs ?BP 129/76 (BP Location: Right Arm)   Pulse 74   Temp (!) 97.5 ?F (36.4 ?C) (Oral)   Resp 15   SpO2 100%  ?Physical Exam ?Vitals and nursing note reviewed.  ?Constitutional:   ?   General: He is not in acute distress. ?   Appearance: Normal appearance. He is not ill-appearing, toxic-appearing or diaphoretic.  ?HENT:  ?   Head: Normocephalic.  ?Eyes:  ?   Conjunctiva/sclera: Conjunctivae normal.  ?Cardiovascular:  ?   Rate and Rhythm: Normal rate and regular rhythm.  ?   Pulses: Normal pulses.  ?Pulmonary:  ?  Effort: Pulmonary effort is normal. No respiratory distress.  ?Musculoskeletal:     ?   General: Tenderness (left subareolar with extension to 2oclock) present. No deformity or signs of injury.  ?   Cervical back: No rigidity.  ?Skin: ?   General: Skin is warm and dry.  ?   Coloration: Skin is not jaundiced or pale.  ?Neurological:  ?   General: No focal deficit present.  ?   Mental Status: He is alert and oriented to person, place, and time.  ? ? ?ED Results / Procedures / Treatments   ?Labs ?(all labs ordered are listed, but only abnormal results are displayed) ?Labs Reviewed  ?COMPREHENSIVE METABOLIC PANEL - Abnormal; Notable for the following components:  ?    Result Value  ? Potassium 5.3 (*)   ? Glucose, Bld 120 (*)    ? All other components within normal limits  ?CBC WITH DIFFERENTIAL/PLATELET  ? ? ?EKG ?None ? ?Radiology ?No results found. ? ?Procedures ?Procedures  ? ? ?Medications Ordered in ED ?Medications - No data to display ? ?ED Course/ Medical Decision Making/ A&P ?  ?                        ?Medical Decision Making ? ? ?81yo male with history of seizure disorder presents with concern for left breast mass.  No redness or fluctuance, no cellulitis, do not see indication for abx at this time. Do not see emergent indication for hospitalization or surgery. ? ?Recommend given location for follow up with breast imaging center and PCP.  Patient discharged in stable condition with understanding of reasons to return.  ? ? ? ? ? ? ? ?Final Clinical Impression(s) / ED Diagnoses ?Final diagnoses:  ?Subareolar mass of left breast  ? ? ?Rx / DC Orders ?ED Discharge Orders   ? ? None  ? ?  ? ? ?  ?Alvira Monday, MD ?06/02/21 2248 ? ?

## 2021-06-02 NOTE — ED Provider Triage Note (Signed)
Emergency Medicine Provider Triage Evaluation Note ? ?Mario Waters , a 81 y.o. male  was evaluated in triage.  Pt complains of painful breast mass. States that same is located in his left chest immediately underneath his nipple. States that he originally noted it 1 week ago and the pain has been persistent since then. He denies any nipple discharge, fevers, chills, chest pain, or shortness of breath. No hx of malignancy.  ? ?Review of Systems  ?Positive:  ?Negative: See above ? ?Physical Exam  ?BP 137/71   Pulse 76   Temp 97.7 ?F (36.5 ?C) (Oral)   Resp 14   SpO2 98%  ?Gen:   Awake, no distress   ?Resp:  Normal effort  ?MSK:   Moves extremities without difficulty  ?Other:  Palpable tenderness and swelling noted around the left breast area and under the nipple. No overlying skin changes ? ?Medical Decision Making  ?Medically screening exam initiated at 11:15 AM.  Appropriate orders placed.  Mario Waters was informed that the remainder of the evaluation will be completed by another provider, this initial triage assessment does not replace that evaluation, and the importance of remaining in the ED until their evaluation is complete. ? ? ?  ?Bud Face, PA-C ?06/02/21 1117 ? ?

## 2021-06-02 NOTE — ED Triage Notes (Signed)
Pt. Stated, I have a KNOT ON MY LEFT CHEST right on nipple for a week. ?

## 2021-06-02 NOTE — Discharge Instructions (Signed)
You have a small painful mass on exam. I do not see redness to indicate need for antibiotics right now or clear signs of abscess. You need to have this evaluated by the breast imaging center--you can call number above, your PCP can assist with referral as they will want a PCP to send results to.   If you develop worsening redness, fever, or other concerns consider reevaluation with your doctor in the emergency department. ?

## 2021-06-05 ENCOUNTER — Other Ambulatory Visit: Payer: Self-pay | Admitting: Urgent Care

## 2021-06-05 DIAGNOSIS — N6342 Unspecified lump in left breast, subareolar: Secondary | ICD-10-CM

## 2021-07-11 ENCOUNTER — Ambulatory Visit
Admission: RE | Admit: 2021-07-11 | Discharge: 2021-07-11 | Disposition: A | Discharge status: Home / Self Care | Payer: Medicare PPO | Source: Ambulatory Visit | Attending: Urgent Care | Admitting: Urgent Care

## 2021-07-11 ENCOUNTER — Other Ambulatory Visit: Payer: Self-pay | Admitting: Family Medicine

## 2021-07-11 DIAGNOSIS — N6342 Unspecified lump in left breast, subareolar: Secondary | ICD-10-CM

## 2021-08-15 ENCOUNTER — Other Ambulatory Visit: Payer: Self-pay

## 2021-08-15 MED ORDER — PHENYTOIN 50 MG PO CHEW: CHEWABLE_TABLET | ORAL | 0 refills | Status: DC

## 2021-08-15 NOTE — Progress Notes (Signed)
Rx refilled.

## 2021-09-18 NOTE — Progress Notes (Unsigned)
Patient: Mario Waters Date of Birth: 10-19-1940  Reason for Visit: Follow up for seizures, peripheral neuropathy History from: Patient Primary Neurologist: [facility]  ASSESSMENT AND PLAN 81 y.o. year old male   1.  History of seizures 2.  History of peripheral neuropathy 3.  Gait disturbance  -No recent seizures, check routine labs -Continue current seizure medications -Follow-up in 1 year or sooner if needed  Meds ordered this encounter  Medications   levETIRAcetam (KEPPRA) 500 MG tablet    Sig: Take 1 tablet (500 mg total) by mouth 2 (two) times daily.    Dispense:  180 tablet    Refill:  4   phenytoin (PHENYTOIN INFATABS) 50 MG tablet    Sig: CHEW AND SWALLOW 1 TABLET BY MOUTH AT BEDTIME    Dispense:  90 tablet    Refill:  4   phenytoin (DILANTIN) 100 MG ER capsule    Sig: Take 1 capsule (100 mg total) by mouth at bedtime.    Dispense:  90 capsule    Refill:  4   HISTORY OF PRESENT ILLNESS: Today 09/19/21 [name] is here today for follow-up.  Remains on Keppra and Dilantin. Last seizure was about 3 years ago. Described as blacking out, generalized seizure activity. Lives with his wife, he drives. Denies any falls, getting around okay. He has numbness from knees to feet, like ants in feet. They aren't painful, isn't bothersome during sleep. Uses cane. No changes to health. Last visit 8/30 Dilantin level was 19.2, Keppra level was 14.8. No changes to gait or balance. His wife comes here too as a memory patient.  HISTORY  Update 09/19/20 Dr. Anne Hahn: Mr. Maysonet is an 81 year old right-handed black male with a history of seizures.  The seizures have been under good control with Dilantin and Keppra combination.  He reports a problem with peripheral neuropathy with numbness up to the knees bilaterally.  He has a prior history of cervical spine surgery in the past, he had numbness in the hands and feet prior to surgery, his hand numbness has improved after surgery.   He has a chronic gait disorder, he fell and February 2021 fracturing the left hip, but he has not had any further falls.  He uses a cane when he is outside of the house.  He reports a lot of discomfort in the right knee, around 1 July he had significant swelling of the knee and has pain with weightbearing.  He has not yet seen anybody about the knee issue.  He has not had a seizure in many years.  He denies issues controlling the bowels or the bladder.  He has no pain from his peripheral neuropathy.  REVIEW OF SYSTEMS: Out of a complete 14 system review of symptoms, the patient complains only of the following symptoms, and all other reviewed systems are negative.  See HPI  ALLERGIES: No Known Allergies  HOME MEDICATIONS: Outpatient Medications Prior to Visit  Medication Sig Dispense Refill   acetaminophen (TYLENOL) 500 MG tablet Take 500 mg by mouth every 6 (six) hours as needed.     alfuzosin (UROXATRAL) 10 MG 24 hr tablet alfuzosin ER 10 mg tablet,extended release 24 hr  TAKE 1 TABLET BY MOUTH EVERY DAY     atorvastatin (LIPITOR) 20 MG tablet Take 20 mg by mouth at bedtime.     budesonide-formoterol (SYMBICORT) 80-4.5 MCG/ACT inhaler Inhale 2 puffs into the lungs 2 (two) times daily.     Cholecalciferol (VITAMIN D) 2000 units CAPS  Take 2,000 Units by mouth daily.     clonazePAM (KLONOPIN) 0.5 MG tablet clonazepam 0.5 mg tablet  TAKE 1 TABLET BY MOUTH AT BEDTIME AS NEEDED     cyclobenzaprine (FLEXERIL) 5 MG tablet Take 5 mg by mouth 2 (two) times daily as needed for muscle spasms.     Multiple Vitamins-Minerals (PRESERVISION AREDS 2) CAPS Take 2 capsules by mouth.     PENICILLIN V POTASSIUM PO Take 1 capsule by mouth 2 (two) times daily.     tamsulosin (FLOMAX) 0.4 MG CAPS capsule Take 0.4 mg by mouth daily.     levETIRAcetam (KEPPRA) 500 MG tablet Take 1 tablet (500 mg total) by mouth 2 (two) times daily. 180 tablet 3   phenytoin (DILANTIN) 100 MG ER capsule Take 1 capsule (100 mg total)  by mouth at bedtime. 90 capsule 4   phenytoin (PHENYTOIN INFATABS) 50 MG tablet CHEW AND SWALLOW 1 TABLET BY MOUTH AT BEDTIME 90 tablet 0   enoxaparin (LOVENOX) 40 MG/0.4ML injection Inject 0.4 mLs (40 mg total) into the skin daily. 13.6 mL 0   No facility-administered medications prior to visit.    PAST MEDICAL HISTORY: Past Medical History:  Diagnosis Date   Hip fracture (HCC) 02/2019   Neuropathy    Seizure disorder (HCC)    since 1990s. taking Dilantin 200mg  nightly   Seizures (HCC)     PAST SURGICAL HISTORY: Past Surgical History:  Procedure Laterality Date   CATARACT EXTRACTION, BILATERAL     HIP PINNING,CANNULATED Left 03/04/2019   Procedure: HIP PINNING;  Surgeon: 05/02/2019, MD;  Location: [facility];  Service: Orthopedics;  Laterality: Left;   KNEE SURGERY     PENECTOMY     REPAIR OF PERFORATED ULCER     SPINE SURGERY     Cervical laminectomy   TOE SURGERY      FAMILY HISTORY: Family History  Problem Relation Age of Onset   Cancer Sister        breast   Cancer Sister    Seizures Neg Hx     SOCIAL HISTORY: Social History   Socioeconomic History   Marital status: Married    Spouse name: Not on file   Number of children: 4   Years of education: 8+ home study   Highest education level: Not on file  Occupational History   Occupation: N/A  Tobacco Use   Smoking status: Former    Types: Cigarettes    Quit date: 1980    Years since quitting: 43.6   Smokeless tobacco: Never  Substance and Sexual Activity   Alcohol use: No    Comment: Quit 1980   Drug use: No   Sexual activity: Not on file    Comment: Married  Other Topics Concern   Not on file  Social History Narrative   Lives at home w/ his wife   Right-handed   Caffeine: 1 cup of coffee per day   Social Determinants of Health   Financial Resource Strain: Not on file  Food Insecurity: Not on file  Transportation Needs: Not on file  Physical Activity: Not on file  Stress: Not on file  Social  Connections: Not on file  Intimate Partner Violence: Not on file    PHYSICAL EXAM  Vitals:   09/19/21 0810  BP: 128/65  Pulse: 91  Weight: 152 lb (68.9 kg)  Height: 5\' 11"  (1.803 m)   Body mass index is 21.2 kg/m.  Generalized: Well developed, in no acute distress  Neurological  examination  Mentation: Alert oriented to time, place, history taking. Follows all commands speech and language fluent Cranial nerve II-XII: Pupils were equal round reactive to light. Extraocular movements were full, visual field were full on confrontational test. Facial sensation and strength were normal. . Head turning and shoulder shrug  were normal and symmetric. Motor: The motor testing reveals 5 over 5 strength of all 4 extremities. Good symmetric motor tone is noted throughout.  Sensory: Length dependent sensory deficit to knee level bilaterally to soft touch Coordination: Cerebellar testing reveals good finger-nose-finger bilaterally, hard time performing heel-to-shin Gait and station: Has to push off from seated position to stand, gait is wide-based, cautious, uses single-point cane in the hallway Reflexes: Deep tendon reflexes are symmetric but decreased to the right knee  DIAGNOSTIC DATA (LABS, IMAGING, TESTING) - I reviewed patient records, labs, notes, testing and imaging myself where available.  Lab Results  Component Value Date   WBC 4.6 06/02/2021   HGB 14.2 06/02/2021   HCT 43.1 06/02/2021   MCV 98.2 06/02/2021   PLT 217 06/02/2021      Component Value Date/Time   NA 137 06/02/2021 1116   NA 141 09/19/2020 0901   K 5.3 (H) 06/02/2021 1116   CL 101 06/02/2021 1116   CO2 29 06/02/2021 1116   GLUCOSE 120 (H) 06/02/2021 1116   BUN 13 06/02/2021 1116   BUN 11 09/19/2020 0901   CREATININE 1.00 06/02/2021 1116   CREATININE 0.95 09/21/2015 1327   CALCIUM 9.1 06/02/2021 1116   PROT 7.3 06/02/2021 1116   PROT 7.7 09/19/2020 0901   ALBUMIN 4.3 06/02/2021 1116   ALBUMIN 4.8 (H)  09/19/2020 0901   AST 28 06/02/2021 1116   ALT 25 06/02/2021 1116   ALKPHOS 79 06/02/2021 1116   BILITOT 0.6 06/02/2021 1116   BILITOT <0.2 09/19/2020 0901   GFRNONAA >60 06/02/2021 1116   GFRAA 92 09/20/2019 0822   No results found for: "CHOL", "HDL", "LDLCALC", "LDLDIRECT", "TRIG", "CHOLHDL" No results found for: "HGBA1C" Lab Results  Component Value Date   VITAMINB12 604 06/03/2016   No results found for: "TSH"  Butler Denmark, Laqueta Jean, DNP 09/19/2021, 8:32 AM [facility] [address] [place], [place] [address] [phone]

## 2021-09-19 ENCOUNTER — Encounter: Payer: Self-pay | Admitting: Neurology

## 2021-09-19 ENCOUNTER — Ambulatory Visit: Payer: Medicare PPO | Admitting: Neurology

## 2021-09-19 VITALS — BP 128/65 | HR 91 | Ht 71.0 in | Wt 152.0 lb

## 2021-09-19 DIAGNOSIS — G40909 Epilepsy, unspecified, not intractable, without status epilepticus: Secondary | ICD-10-CM | POA: Diagnosis not present

## 2021-09-19 DIAGNOSIS — G603 Idiopathic progressive neuropathy: Secondary | ICD-10-CM | POA: Diagnosis not present

## 2021-09-19 MED ORDER — LEVETIRACETAM 500 MG PO TABS: 500.0000 mg | ORAL_TABLET | Freq: Two times a day (BID) | ORAL | 4 refills | Status: DC

## 2021-09-19 MED ORDER — PHENYTOIN 50 MG PO CHEW: CHEWABLE_TABLET | ORAL | 4 refills | Status: DC

## 2021-09-19 MED ORDER — PHENYTOIN SODIUM EXTENDED 100 MG PO CAPS: 100.0000 mg | ORAL_CAPSULE | Freq: Every day | ORAL | 4 refills | Status: DC

## 2021-09-19 NOTE — Patient Instructions (Signed)
Great to see you today! Continue current medications Check labs today  Call for any seizures  I will see you back in 1 year

## 2021-09-20 LAB — CBC WITH DIFFERENTIAL/PLATELET
Basophils Absolute: 0.1 10*3/uL (ref 0.0–0.2)
Basos: 2 %
EOS (ABSOLUTE): 0.2 10*3/uL (ref 0.0–0.4)
Eos: 6 %
Hematocrit: 37.4 % — ABNORMAL LOW (ref 37.5–51.0)
Hemoglobin: 13 g/dL (ref 13.0–17.7)
Immature Grans (Abs): 0 10*3/uL (ref 0.0–0.1)
Immature Granulocytes: 0 %
Lymphocytes Absolute: 1.6 10*3/uL (ref 0.7–3.1)
Lymphs: 43 %
MCH: 32.8 pg (ref 26.6–33.0)
MCHC: 34.8 g/dL (ref 31.5–35.7)
MCV: 94 fL (ref 79–97)
Monocytes Absolute: 0.4 10*3/uL (ref 0.1–0.9)
Monocytes: 10 %
Neutrophils Absolute: 1.5 10*3/uL (ref 1.4–7.0)
Neutrophils: 39 %
Platelets: 219 10*3/uL (ref 150–450)
RBC: 3.96 x10E6/uL — ABNORMAL LOW (ref 4.14–5.80)
RDW: 12.3 % (ref 11.6–15.4)
WBC: 3.8 10*3/uL (ref 3.4–10.8)

## 2021-09-20 LAB — COMPREHENSIVE METABOLIC PANEL
ALT: 15 IU/L (ref 0–44)
AST: 20 IU/L (ref 0–40)
Albumin/Globulin Ratio: 1.6 (ref 1.2–2.2)
Albumin: 4.6 g/dL (ref 3.7–4.7)
Alkaline Phosphatase: 101 IU/L (ref 44–121)
BUN/Creatinine Ratio: 9 — ABNORMAL LOW (ref 10–24)
BUN: 8 mg/dL (ref 8–27)
Bilirubin Total: <0.2 mg/dL (ref 0.0–1.2)
CO2: 26 mmol/L (ref 20–29)
Calcium: 8.8 mg/dL (ref 8.6–10.2)
Chloride: 98 mmol/L (ref 96–106)
Creatinine, Ser: 0.89 mg/dL (ref 0.76–1.27)
Globulin, Total: 2.9 g/dL (ref 1.5–4.5)
Glucose: 120 mg/dL — ABNORMAL HIGH (ref 70–99)
Potassium: 4.2 mmol/L (ref 3.5–5.2)
Sodium: 138 mmol/L (ref 134–144)
Total Protein: 7.5 g/dL (ref 6.0–8.5)
eGFR: 85 mL/min/{1.73_m2} (ref >59)

## 2021-09-20 LAB — PHENYTOIN LEVEL, TOTAL: Phenytoin (Dilantin), Serum: 14.2 ug/mL (ref 10.0–20.0)

## 2021-09-20 LAB — LEVETIRACETAM LEVEL: Levetiracetam Lvl: 16.2 ug/mL (ref 10.0–40.0)

## 2021-09-25 ENCOUNTER — Telehealth: Payer: Self-pay

## 2021-09-25 NOTE — Telephone Encounter (Signed)
-----   Message from Glean Salvo, NP sent at 09/25/2021  9:12 AM EDT ----- Please call the patient, blood work is unremarkable, shows no significant abnormalities.  Dilantin level is 14.2, Keppra level 16.2.  Both are within therapeutic range.  Please let me know if he has any questions. Thanks!

## 2021-09-25 NOTE — Telephone Encounter (Signed)
Pt verified by name and DOB,  normal results given per provider, pt voiced understanding all question answered. °

## 2022-08-05 ENCOUNTER — Other Ambulatory Visit: Payer: Self-pay

## 2022-08-05 MED ORDER — PHENYTOIN 50 MG PO CHEW: CHEWABLE_TABLET | ORAL | 4 refills | Status: DC

## 2022-08-05 NOTE — Telephone Encounter (Signed)
  Looks like he should be on both based on your last note just wanted to clarify: Meds ordered this encounter on 09/19/21 progress note  Medications   levETIRAcetam (KEPPRA) 500 MG tablet      Sig: Take 1 tablet (500 mg total) by mouth 2 (two) times daily.      Dispense:  180 tablet      Refill:  4   phenytoin (PHENYTOIN INFATABS) 50 MG tablet      Sig: CHEW AND SWALLOW 1 TABLET BY MOUTH AT BEDTIME      Dispense:  90 tablet      Refill:  4   phenytoin (DILANTIN) 100 MG ER capsule      Sig: Take 1 capsule (100 mg total) by mouth at bedtime.      Dispense:  90 capsule      Refill:  4

## 2022-09-12 NOTE — Progress Notes (Signed)
Patient: Mario Waters Date of Birth: [birth year]  Reason for Visit: Follow up for seizures, peripheral neuropathy History from: Patient Primary Neurologist: Willis/[facility]  ASSESSMENT AND PLAN 82 y.o. year old male   1.  History of seizures 2.  History of peripheral neuropathy 3.  Gait disturbance  -Last seizure around 2018, check labs today  -Will continue current doses of Keppra and Dilantin for seizure prevention.  He is on vitamin D supplementation -Going to pain management for peripheral neuropathy -Follow-up in 1 year or sooner if needed, will call for seizures, will have him see Dr. Teresa Coombs at next visit to establish seizure care   Meds ordered this encounter  Medications   phenytoin (PHENYTOIN INFATABS) 50 MG tablet    Sig: CHEW AND SWALLOW 1 TABLET BY MOUTH AT BEDTIME    Dispense:  90 tablet    Refill:  4   phenytoin (DILANTIN) 100 MG ER capsule    Sig: Take 1 capsule (100 mg total) by mouth at bedtime.    Dispense:  90 capsule    Refill:  4   levETIRAcetam (KEPPRA) 500 MG tablet    Sig: Take 1 tablet (500 mg total) by mouth 2 (two) times daily.    Dispense:  180 tablet    Refill:  4   HISTORY OF PRESENT ILLNESS: Today 09/17/22 Labs in August 2023 Dilantin level 14.2, Keppra level 16.2. Remains on Keppra and Dilantin. No recent seizures. He thinks last seizure was in 2018. He drives. His feet are numb from neuropathy. Uses cane. Feels crawling feeling in his feet. Larey Seat on the curb a month ago carrying groceries in. Paresthesia bothersome with prolonged standing, improves with sitting. Seeing pain management at [facility] for neuropathy, arthritis. Tried Lyrica, gabapentin. Now on tramadol. Takes Klonopin at night for sleep. Takes Vitamin D.  Update 09/19/21 SS; [name] is here today for follow-up.  Remains on Keppra and Dilantin. Last seizure was about 3 years ago. Described as blacking out, generalized seizure activity. Lives with his wife, he drives.  Denies any falls, getting around okay. He has numbness from knees to feet, like ants in feet. They aren't painful, isn't bothersome during sleep. Uses cane. No changes to health. Last visit 8/30 Dilantin level was 19.2, Keppra level was 14.8. No changes to gait or balance. His wife comes here too as a memory patient.  HISTORY  Update 09/19/20 Dr. Anne Hahn: [name] is an 82 year old right-handed black male with a history of seizures.  The seizures have been under good control with Dilantin and Keppra combination.  He reports a problem with peripheral neuropathy with numbness up to the knees bilaterally.  He has a prior history of cervical spine surgery in the past, he had numbness in the hands and feet prior to surgery, his hand numbness has improved after surgery.  He has a chronic gait disorder, he fell and February 2021 fracturing the left hip, but he has not had any further falls.  He uses a cane when he is outside of the house.  He reports a lot of discomfort in the right knee, around 1 July he had significant swelling of the knee and has pain with weightbearing.  He has not yet seen anybody about the knee issue.  He has not had a seizure in many years.  He denies issues controlling the bowels or the bladder.  He has no pain from his peripheral neuropathy.  REVIEW OF SYSTEMS: Out of a complete 14 system review of  symptoms, the patient complains only of the following symptoms, and all other reviewed systems are negative.  See HPI  ALLERGIES: No Known Allergies  HOME MEDICATIONS: Outpatient Medications Prior to Visit  Medication Sig Dispense Refill   acetaminophen (TYLENOL) 500 MG tablet Take 500 mg by mouth every 6 (six) hours as needed.     alfuzosin (UROXATRAL) 10 MG 24 hr tablet alfuzosin ER 10 mg tablet,extended release 24 hr  TAKE 1 TABLET BY MOUTH EVERY DAY     atorvastatin (LIPITOR) 20 MG tablet Take 20 mg by mouth at bedtime.     budesonide-formoterol (SYMBICORT) 80-4.5 MCG/ACT inhaler  Inhale 2 puffs into the lungs 2 (two) times daily.     Cholecalciferol (VITAMIN D) 2000 units CAPS Take 2,000 Units by mouth daily.     clonazePAM (KLONOPIN) 0.5 MG tablet clonazepam 0.5 mg tablet  TAKE 1 TABLET BY MOUTH AT BEDTIME AS NEEDED     cyclobenzaprine (FLEXERIL) 5 MG tablet Take 5 mg by mouth 2 (two) times daily as needed for muscle spasms.     Multiple Vitamins-Minerals (PRESERVISION AREDS 2) CAPS Take 2 capsules by mouth.     tamsulosin (FLOMAX) 0.4 MG CAPS capsule Take 0.4 mg by mouth daily.     levETIRAcetam (KEPPRA) 500 MG tablet Take 1 tablet (500 mg total) by mouth 2 (two) times daily. 180 tablet 4   phenytoin (DILANTIN) 100 MG ER capsule Take 1 capsule (100 mg total) by mouth at bedtime. 90 capsule 4   phenytoin (PHENYTOIN INFATABS) 50 MG tablet CHEW AND SWALLOW 1 TABLET BY MOUTH AT BEDTIME 90 tablet 4   enoxaparin (LOVENOX) 40 MG/0.4ML injection Inject 0.4 mLs (40 mg total) into the skin daily. 13.6 mL 0   PENICILLIN V POTASSIUM PO Take 1 capsule by mouth 2 (two) times daily. (Patient not taking: Reported on 09/17/2022)     No facility-administered medications prior to visit.    PAST MEDICAL HISTORY: Past Medical History:  Diagnosis Date   Hip fracture (HCC) 02/2019   Neuropathy    Seizure disorder (HCC)    since 1990s. taking Dilantin 200mg  nightly   Seizures (HCC)     PAST SURGICAL HISTORY: Past Surgical History:  Procedure Laterality Date   CATARACT EXTRACTION, BILATERAL     HIP PINNING,CANNULATED Left 03/04/2019   Procedure: HIP PINNING;  Surgeon: Teryl Lucy, MD;  Location: [facility] OR;  Service: Orthopedics;  Laterality: Left;   KNEE SURGERY     PENECTOMY     REPAIR OF PERFORATED ULCER     SPINE SURGERY     Cervical laminectomy   TOE SURGERY      FAMILY HISTORY: Family History  Problem Relation Age of Onset   Cancer Sister        breast   Cancer Sister    Seizures Neg Hx     SOCIAL HISTORY: Social History   Socioeconomic History   Marital  status: Married    Spouse name: Not on file   Number of children: 4   Years of education: 8+ home study   Highest education level: Not on file  Occupational History   Occupation: N/A  Tobacco Use   Smoking status: Former    Current packs/day: 0.00    Types: Cigarettes    Quit date: 1980    Years since quitting: 44.6   Smokeless tobacco: Never  Substance and Sexual Activity   Alcohol use: No    Comment: Quit 1980   Drug use: No   Sexual  activity: Not on file    Comment: Married  Other Topics Concern   Not on file  Social History Narrative   Lives at home w/ his wife   Right-handed   Caffeine: 1 cup of coffee per day   Social Determinants of Health   Financial Resource Strain: Not on file  Food Insecurity: Not on file  Transportation Needs: Not on file  Physical Activity: Not on file  Stress: Not on file  Social Connections: Not on file  Intimate Partner Violence: Not on file   PHYSICAL EXAM  Vitals:   09/17/22 0838  BP: 108/72  Weight: 143 lb (64.9 kg)  Height: 5\' 11"  (1.803 m)    Body mass index is 19.94 kg/m.  Generalized: Well developed, in no acute distress  Neurological examination  Mentation: Alert oriented to time, place, history taking. Follows all commands speech and language fluent Cranial nerve II-XII: Pupils were equal round reactive to light. Extraocular movements were full, visual field were full on confrontational test. Facial sensation and strength were normal. . Head turning and shoulder shrug  were normal and symmetric. Motor: The motor testing reveals 5 over 5 strength of all 4 extremities. Good symmetric motor tone is noted throughout.  Sensory: Length dependent sensory deficit to knee level bilaterally to soft touch Coordination: Cerebellar testing reveals good finger-nose-finger bilaterally, hard time performing heel-to-shin Gait and station: Has to push off from seated position to stand, gait is wide-based, cautious, uses single-point cane  in the hallway Reflexes: Deep tendon reflexes are symmetric but decreased to the right knee  DIAGNOSTIC DATA (LABS, IMAGING, TESTING) - I reviewed patient records, labs, notes, testing and imaging myself where available.  Lab Results  Component Value Date   WBC 3.8 09/19/2021   HGB 13.0 09/19/2021   HCT 37.4 (L) 09/19/2021   MCV 94 09/19/2021   PLT 219 09/19/2021      Component Value Date/Time   NA 138 09/19/2021 0839   K 4.2 09/19/2021 0839   CL 98 09/19/2021 0839   CO2 26 09/19/2021 0839   GLUCOSE 120 (H) 09/19/2021 0839   GLUCOSE 120 (H) 06/02/2021 1116   BUN 8 09/19/2021 0839   CREATININE 0.89 09/19/2021 0839   CREATININE 0.95 09/21/2015 1327   CALCIUM 8.8 09/19/2021 0839   PROT 7.5 09/19/2021 0839   ALBUMIN 4.6 09/19/2021 0839   AST 20 09/19/2021 0839   ALT 15 09/19/2021 0839   ALKPHOS 101 09/19/2021 0839   BILITOT <0.2 09/19/2021 0839   GFRNONAA >60 06/02/2021 1116   GFRAA 92 09/20/2019 0822   No results found for: "CHOL", "HDL", "LDLCALC", "LDLDIRECT", "TRIG", "CHOLHDL" No results found for: "HGBA1C" Lab Results  Component Value Date   VITAMINB12 604 06/03/2016   No results found for: "TSH"  Margie Ege, AGNP-C, DNP 09/17/2022, 9:06 AM [facility] [address] [place], [place] [address] [phone]

## 2022-09-17 ENCOUNTER — Ambulatory Visit: Payer: Medicare HMO | Admitting: Neurology

## 2022-09-17 ENCOUNTER — Encounter: Payer: Self-pay | Admitting: Neurology

## 2022-09-17 VITALS — BP 108/72 | Ht 71.0 in | Wt 143.0 lb

## 2022-09-17 DIAGNOSIS — G40909 Epilepsy, unspecified, not intractable, without status epilepticus: Secondary | ICD-10-CM

## 2022-09-17 MED ORDER — LEVETIRACETAM 500 MG PO TABS: 500.0000 mg | ORAL_TABLET | Freq: Two times a day (BID) | ORAL | 4 refills | Status: DC

## 2022-09-17 MED ORDER — PHENYTOIN SODIUM EXTENDED 100 MG PO CAPS: 100.0000 mg | ORAL_CAPSULE | Freq: Every day | ORAL | 4 refills | Status: DC

## 2022-09-17 MED ORDER — PHENYTOIN 50 MG PO CHEW: CHEWABLE_TABLET | ORAL | 4 refills | Status: DC

## 2022-09-17 NOTE — Patient Instructions (Signed)
Check labs, continue current medications

## 2022-09-19 ENCOUNTER — Telehealth: Payer: Self-pay

## 2022-09-19 LAB — COMPREHENSIVE METABOLIC PANEL
ALT: 17 IU/L (ref 0–44)
AST: 19 IU/L (ref 0–40)
Albumin: 4.5 g/dL (ref 3.7–4.7)
Alkaline Phosphatase: 94 IU/L (ref 44–121)
BUN/Creatinine Ratio: 10 (ref 10–24)
BUN: 9 mg/dL (ref 8–27)
Bilirubin Total: 0.3 mg/dL (ref 0.0–1.2)
CO2: 26 mmol/L (ref 20–29)
Calcium: 9.2 mg/dL (ref 8.6–10.2)
Chloride: 98 mmol/L (ref 96–106)
Creatinine, Ser: 0.92 mg/dL (ref 0.76–1.27)
Globulin, Total: 2.9 g/dL (ref 1.5–4.5)
Glucose: 104 mg/dL — ABNORMAL HIGH (ref 70–99)
Potassium: 3.9 mmol/L (ref 3.5–5.2)
Sodium: 139 mmol/L (ref 134–144)
Total Protein: 7.4 g/dL (ref 6.0–8.5)
eGFR: 82 mL/min/{1.73_m2} (ref >59)

## 2022-09-19 LAB — CBC WITH DIFFERENTIAL/PLATELET
Basophils Absolute: 0 10*3/uL (ref 0.0–0.2)
Basos: 1 %
EOS (ABSOLUTE): 0.2 10*3/uL (ref 0.0–0.4)
Eos: 4 %
Hematocrit: 38.7 % (ref 37.5–51.0)
Hemoglobin: 13.5 g/dL (ref 13.0–17.7)
Immature Grans (Abs): 0 10*3/uL (ref 0.0–0.1)
Immature Granulocytes: 0 %
Lymphocytes Absolute: 1.6 10*3/uL (ref 0.7–3.1)
Lymphs: 33 %
MCH: 32.4 pg (ref 26.6–33.0)
MCHC: 34.9 g/dL (ref 31.5–35.7)
MCV: 93 fL (ref 79–97)
Monocytes Absolute: 0.5 10*3/uL (ref 0.1–0.9)
Monocytes: 9 %
Neutrophils Absolute: 2.5 10*3/uL (ref 1.4–7.0)
Neutrophils: 53 %
Platelets: 281 10*3/uL (ref 150–450)
RBC: 4.17 x10E6/uL (ref 4.14–5.80)
RDW: 11.9 % (ref 11.6–15.4)
WBC: 4.8 10*3/uL (ref 3.4–10.8)

## 2022-09-19 LAB — LEVETIRACETAM LEVEL: Levetiracetam Lvl: 28.9 ug/mL (ref 10.0–40.0)

## 2022-09-19 LAB — PHENYTOIN LEVEL, TOTAL: Phenytoin (Dilantin), Serum: 12.9 ug/mL (ref 10.0–20.0)

## 2022-09-19 NOTE — Telephone Encounter (Signed)
-----   Message from Glean Salvo sent at 09/19/2022  8:24 AM EDT ----- Blood work is overall normal.  Has good levels of seizure medications.  No change to dosing.  Thanks

## 2022-09-19 NOTE — Telephone Encounter (Signed)
Called and updated patient on results. Pt verbalized understanding. Pt had no questions at this time but was encouraged to call back if questions arise.

## 2022-09-23 ENCOUNTER — Emergency Department (HOSPITAL_COMMUNITY): Payer: Medicare HMO

## 2022-09-23 ENCOUNTER — Emergency Department (HOSPITAL_COMMUNITY)
Admission: EM | Admit: 2022-09-23 | Discharge: 2022-09-23 | Disposition: A | Discharge status: Home / Self Care | Payer: Medicare HMO | Attending: Emergency Medicine | Admitting: Emergency Medicine

## 2022-09-23 ENCOUNTER — Encounter (HOSPITAL_COMMUNITY): Payer: Self-pay

## 2022-09-23 DIAGNOSIS — W010XXA Fall on same level from slipping, tripping and stumbling without subsequent striking against object, initial encounter: Secondary | ICD-10-CM | POA: Insufficient documentation

## 2022-09-23 DIAGNOSIS — W19XXXA Unspecified fall, initial encounter: Secondary | ICD-10-CM

## 2022-09-23 DIAGNOSIS — Y9301 Activity, walking, marching and hiking: Secondary | ICD-10-CM | POA: Diagnosis not present

## 2022-09-23 DIAGNOSIS — R9082 White matter disease, unspecified: Secondary | ICD-10-CM | POA: Insufficient documentation

## 2022-09-23 DIAGNOSIS — S01511A Laceration without foreign body of lip, initial encounter: Secondary | ICD-10-CM | POA: Diagnosis not present

## 2022-09-23 DIAGNOSIS — Y92481 Parking lot as the place of occurrence of the external cause: Secondary | ICD-10-CM | POA: Diagnosis not present

## 2022-09-23 DIAGNOSIS — S0993XA Unspecified injury of face, initial encounter: Secondary | ICD-10-CM

## 2022-09-23 DIAGNOSIS — Z23 Encounter for immunization: Secondary | ICD-10-CM | POA: Diagnosis not present

## 2022-09-23 DIAGNOSIS — G319 Degenerative disease of nervous system, unspecified: Secondary | ICD-10-CM | POA: Diagnosis not present

## 2022-09-23 MED ORDER — ACETAMINOPHEN 500 MG PO TABS
1000.0000 mg | ORAL_TABLET | Freq: Once | ORAL | Status: AC
  Administered 2022-09-23: 1000 mg via ORAL
  Filled 2022-09-23: qty 2

## 2022-09-23 MED ORDER — TETANUS-DIPHTH-ACELL PERTUSSIS 5-2.5-18.5 LF-MCG/0.5 IM SUSY
0.5000 mL | PREFILLED_SYRINGE | Freq: Once | INTRAMUSCULAR | Status: AC
  Administered 2022-09-23: 0.5 mL via INTRAMUSCULAR
  Filled 2022-09-23: qty 0.5

## 2022-09-23 NOTE — Discharge Instructions (Signed)
You were seen in the emergent department after a fall.  As we discussed we got imaging of your head, face, neck, chest, hips, and right knee which did not show any broken bones or evidence of internal bleeding.  I cleaned the wounds on your lips.  We updated your tetanus and give you some Tylenol.  Continue to monitor how you're doing and return to the ER for new or worsening symptoms.

## 2022-09-23 NOTE — ED Provider Notes (Signed)
[place] EMERGENCY DEPARTMENT AT [facility] Provider Note   CSN: [id number] Arrival date & time: 09/23/22  1922     History  Chief Complaint  Patient presents with   Fall   Facial Injury    Mario Waters is a 82 y.o. male with history of seizure disorder on phenytoin, neuropathy, arthritis, cataracts, who presents the emergency department after a mechanical fall.  Patient coming from home where he was walking in the parking lot of his apartment building, and tripped over a curb.  He states that he fell directly forward onto his face, breaking his dentures.  He did lose consciousness for a few seconds.  He is complaining of pain with talking, lacerations to his lips, and some pain in his right knee.  He ambulates with a cane at baseline.  He was placed in a c-collar prior to ER arrival.  He is not on anticoagulation.   Fall  Facial Injury      Home Medications Prior to Admission medications   Medication Sig Start Date End Date Taking? Authorizing Provider  acetaminophen (TYLENOL) 500 MG tablet Take 500 mg by mouth every 6 (six) hours as needed.    [provider]  alfuzosin (UROXATRAL) 10 MG 24 hr tablet alfuzosin ER 10 mg tablet,extended release 24 hr  TAKE 1 TABLET BY MOUTH EVERY DAY    [provider]  atorvastatin (LIPITOR) 20 MG tablet Take 20 mg by mouth at bedtime. 12/14/18   [provider]  budesonide-formoterol (SYMBICORT) 80-4.5 MCG/ACT inhaler Inhale 2 puffs into the lungs 2 (two) times daily.    [provider]  Cholecalciferol (VITAMIN D) 2000 units CAPS Take 2,000 Units by mouth daily.    [provider]  clonazePAM (KLONOPIN) 0.5 MG tablet clonazepam 0.5 mg tablet  TAKE 1 TABLET BY MOUTH AT BEDTIME AS NEEDED    [provider]  cyclobenzaprine (FLEXERIL) 5 MG tablet Take 5 mg by mouth 2 (two) times daily as needed for muscle spasms.    [provider]  enoxaparin (LOVENOX) 40 MG/0.4ML  injection Inject 0.4 mLs (40 mg total) into the skin daily. 03/06/19 04/09/19  Lorenso Courier, MD  levETIRAcetam (KEPPRA) 500 MG tablet Take 1 tablet (500 mg total) by mouth 2 (two) times daily. 09/17/22   Glean Salvo, NP  Multiple Vitamins-Minerals (PRESERVISION AREDS 2) CAPS Take 2 capsules by mouth.    [provider]  PENICILLIN V POTASSIUM PO Take 1 capsule by mouth 2 (two) times daily. Patient not taking: Reported on 09/17/2022    [provider]  phenytoin (DILANTIN) 100 MG ER capsule Take 1 capsule (100 mg total) by mouth at bedtime. 09/17/22   Glean Salvo, NP  phenytoin (PHENYTOIN INFATABS) 50 MG tablet CHEW AND SWALLOW 1 TABLET BY MOUTH AT BEDTIME 09/17/22   Glean Salvo, NP  tamsulosin (FLOMAX) 0.4 MG CAPS capsule Take 0.4 mg by mouth daily. 09/18/21   [provider]      Allergies    Patient has no known allergies.    Review of Systems   Review of Systems  Musculoskeletal:  Positive for arthralgias.  Skin:  Positive for wound.  All other systems reviewed and are negative.   Physical Exam Updated Vital Signs BP (!) 149/81   Pulse 78   Temp (!) 97.5 F (36.4 C) (Oral)   Resp 17   SpO2 100%  Physical Exam Vitals and nursing note reviewed.  Constitutional:  Appearance: Normal appearance.  HENT:     Head: Normocephalic and atraumatic.     Comments: Laceration noted to the exterior upper lip, does not cross vermilion border  Additional laceration noted to the anterior upper lip, does not communicate to the outside.  Wounds not requiring suture repair    Mouth/Throat:     Comments: Not wearing dentures Eyes:     Conjunctiva/sclera: Conjunctivae normal.  Neck:     Comments: In C-collar Cardiovascular:     Rate and Rhythm: Normal rate and regular rhythm.  Pulmonary:     Effort: Pulmonary effort is normal. No respiratory distress.     Breath sounds: Normal breath sounds.  Chest:     Comments: Chest wall stable Abdominal:      General: There is no distension.     Palpations: Abdomen is soft.     Tenderness: There is no abdominal tenderness.  Musculoskeletal:     Comments: Pelvis stable.  Right knee swelling without breaks in the skin.  No point tenderness or obvious deformity  Skin:    General: Skin is warm and dry.  Neurological:     General: No focal deficit present.     Mental Status: He is alert.     ED Results / Procedures / Treatments   Labs (all labs ordered are listed, but only abnormal results are displayed) Labs Reviewed - No data to display  EKG None  Radiology CT Head Wo Contrast  Result Date: 09/23/2022 CLINICAL DATA:  Fall with head, neck, and facial trauma loss of consciousness. Patient landed face first breaking dentures. Swelling and laceration to lower lip. In C-collar on arrival. EXAM: CT HEAD WITHOUT CONTRAST CT MAXILLOFACIAL WITHOUT CONTRAST CT CERVICAL SPINE WITHOUT CONTRAST TECHNIQUE: Multidetector CT imaging of the head, cervical spine, and maxillofacial structures were performed using the standard protocol without intravenous contrast. Multiplanar CT image reconstructions of the cervical spine and maxillofacial structures were also generated. RADIATION DOSE REDUCTION: This exam was performed according to the departmental dose-optimization program which includes automated exposure control, adjustment of the mA and/or kV according to patient size and/or use of iterative reconstruction technique. COMPARISON:  CT head 03/03/2019 and CT cervical spine 11/06/2016 FINDINGS: CT HEAD FINDINGS Brain: No intracranial hemorrhage, mass effect, or evidence of acute infarct. No hydrocephalus. No extra-axial fluid collection. Generalized cerebral atrophy. Ill-defined hypoattenuation within the cerebral white matter is nonspecific but consistent with chronic small vessel ischemic disease. Vascular: No hyperdense vessel. Intracranial arterial calcification. Skull: No fracture or focal lesion. Other: None. CT  MAXILLOFACIAL FINDINGS Osseous: No fracture or mandibular dislocation. No destructive process. Orbits: Negative. No traumatic or inflammatory finding. Sinuses: Clear. Soft tissues: Soft tissue swelling about the lips. CT CERVICAL SPINE FINDINGS Alignment: No evidence of traumatic malalignment. Loss of lordosis is likely chronic/positional. Anterolisthesis of C3 and C4 is favored chronic. Skull base and vertebrae: No acute fracture. No primary bone lesion or focal pathologic process. Soft tissues and spinal canal: No prevertebral fluid or swelling. No visible canal hematoma. Disc levels: Posterior fusion C3-C5. Multilevel age-related degenerative spondylosis. No severe spinal canal narrowing. Upper chest: No acute abnormality. Other: Carotid calcification. IMPRESSION: 1. No acute intracranial abnormality. 2. No acute facial fracture. 3. No acute fracture in the cervical spine. Electronically Signed   By: Minerva Fester M.D.   On: 09/23/2022 21:25   CT Maxillofacial Wo Contrast  Result Date: 09/23/2022 CLINICAL DATA:  Fall with head, neck, and facial trauma loss of consciousness. Patient landed face first breaking dentures.  Swelling and laceration to lower lip. In C-collar on arrival. EXAM: CT HEAD WITHOUT CONTRAST CT MAXILLOFACIAL WITHOUT CONTRAST CT CERVICAL SPINE WITHOUT CONTRAST TECHNIQUE: Multidetector CT imaging of the head, cervical spine, and maxillofacial structures were performed using the standard protocol without intravenous contrast. Multiplanar CT image reconstructions of the cervical spine and maxillofacial structures were also generated. RADIATION DOSE REDUCTION: This exam was performed according to the departmental dose-optimization program which includes automated exposure control, adjustment of the mA and/or kV according to patient size and/or use of iterative reconstruction technique. COMPARISON:  CT head 03/03/2019 and CT cervical spine 11/06/2016 FINDINGS: CT HEAD FINDINGS Brain: No  intracranial hemorrhage, mass effect, or evidence of acute infarct. No hydrocephalus. No extra-axial fluid collection. Generalized cerebral atrophy. Ill-defined hypoattenuation within the cerebral white matter is nonspecific but consistent with chronic small vessel ischemic disease. Vascular: No hyperdense vessel. Intracranial arterial calcification. Skull: No fracture or focal lesion. Other: None. CT MAXILLOFACIAL FINDINGS Osseous: No fracture or mandibular dislocation. No destructive process. Orbits: Negative. No traumatic or inflammatory finding. Sinuses: Clear. Soft tissues: Soft tissue swelling about the lips. CT CERVICAL SPINE FINDINGS Alignment: No evidence of traumatic malalignment. Loss of lordosis is likely chronic/positional. Anterolisthesis of C3 and C4 is favored chronic. Skull base and vertebrae: No acute fracture. No primary bone lesion or focal pathologic process. Soft tissues and spinal canal: No prevertebral fluid or swelling. No visible canal hematoma. Disc levels: Posterior fusion C3-C5. Multilevel age-related degenerative spondylosis. No severe spinal canal narrowing. Upper chest: No acute abnormality. Other: Carotid calcification. IMPRESSION: 1. No acute intracranial abnormality. 2. No acute facial fracture. 3. No acute fracture in the cervical spine. Electronically Signed   By: Minerva Fester M.D.   On: 09/23/2022 21:25   CT Cervical Spine Wo Contrast  Result Date: 09/23/2022 CLINICAL DATA:  Fall with head, neck, and facial trauma loss of consciousness. Patient landed face first breaking dentures. Swelling and laceration to lower lip. In C-collar on arrival. EXAM: CT HEAD WITHOUT CONTRAST CT MAXILLOFACIAL WITHOUT CONTRAST CT CERVICAL SPINE WITHOUT CONTRAST TECHNIQUE: Multidetector CT imaging of the head, cervical spine, and maxillofacial structures were performed using the standard protocol without intravenous contrast. Multiplanar CT image reconstructions of the cervical spine and  maxillofacial structures were also generated. RADIATION DOSE REDUCTION: This exam was performed according to the departmental dose-optimization program which includes automated exposure control, adjustment of the mA and/or kV according to patient size and/or use of iterative reconstruction technique. COMPARISON:  CT head 03/03/2019 and CT cervical spine 11/06/2016 FINDINGS: CT HEAD FINDINGS Brain: No intracranial hemorrhage, mass effect, or evidence of acute infarct. No hydrocephalus. No extra-axial fluid collection. Generalized cerebral atrophy. Ill-defined hypoattenuation within the cerebral white matter is nonspecific but consistent with chronic small vessel ischemic disease. Vascular: No hyperdense vessel. Intracranial arterial calcification. Skull: No fracture or focal lesion. Other: None. CT MAXILLOFACIAL FINDINGS Osseous: No fracture or mandibular dislocation. No destructive process. Orbits: Negative. No traumatic or inflammatory finding. Sinuses: Clear. Soft tissues: Soft tissue swelling about the lips. CT CERVICAL SPINE FINDINGS Alignment: No evidence of traumatic malalignment. Loss of lordosis is likely chronic/positional. Anterolisthesis of C3 and C4 is favored chronic. Skull base and vertebrae: No acute fracture. No primary bone lesion or focal pathologic process. Soft tissues and spinal canal: No prevertebral fluid or swelling. No visible canal hematoma. Disc levels: Posterior fusion C3-C5. Multilevel age-related degenerative spondylosis. No severe spinal canal narrowing. Upper chest: No acute abnormality. Other: Carotid calcification. IMPRESSION: 1. No acute intracranial abnormality. 2. No acute  facial fracture. 3. No acute fracture in the cervical spine. Electronically Signed   By: Minerva Fester M.D.   On: 09/23/2022 21:25   DG Knee Complete 4 Views Right  Result Date: 09/23/2022 CLINICAL DATA:  Status post fall, right knee pain. EXAM: RIGHT KNEE - COMPLETE 4+ VIEW COMPARISON:  Radiograph  10/03/2020 FINDINGS: No fracture or dislocation. Moderate osteoarthritis, prominent in the medial tibiofemoral compartment where there is joint space narrowing and peripheral spurring. Mild spurring of the tibial spines. There is a small knee joint effusion. No erosive change. Vascular calcifications are seen. IMPRESSION: 1. No fracture or dislocation of the right knee. 2. Moderate osteoarthritis and small joint effusion. Electronically Signed   By: Narda Rutherford M.D.   On: 09/23/2022 21:08   DG Pelvis Portable  Result Date: 09/23/2022 CLINICAL DATA:  Fall with pelvic pain. EXAM: PORTABLE PELVIS 1-2 VIEWS COMPARISON:  03/03/2019 FINDINGS: The cortical margins of the bony pelvis are intact. No fracture. Three screws traverse the left femoral neck. Pubic symphysis and sacroiliac joints are congruent. Both femoral heads are well-seated in the respective acetabula. IMPRESSION: 1. No acute fracture of the pelvis. 2. Prior left femoral neck fixation. Electronically Signed   By: Narda Rutherford M.D.   On: 09/23/2022 21:07   DG Chest Portable 1 View  Result Date: 09/23/2022 CLINICAL DATA:  Status post fall, pain. EXAM: PORTABLE CHEST 1 VIEW COMPARISON:  Chest radiograph 03/03/2019 FINDINGS: The cardiomediastinal contours are normal. The lungs are clear. Pulmonary vasculature is normal. No consolidation, pleural effusion, or pneumothorax. No acute osseous abnormalities are seen. IMPRESSION: No acute chest findings or evidence of traumatic injury. Electronically Signed   By: Narda Rutherford M.D.   On: 09/23/2022 21:06    Procedures Procedures    Medications Ordered in ED Medications  Tdap (BOOSTRIX) injection 0.5 mL (has no administration in time range)  acetaminophen (TYLENOL) tablet 1,000 mg (1,000 mg Oral Given 09/23/22 2137)    ED Course/ Medical Decision Making/ A&P                                 Medical Decision Making Amount and/or Complexity of Data Reviewed Radiology: ordered.   This  patient is a 82 y.o. male  who presents to the ED for concern of injuries from mechanical fall. + LOC. Not on blood thinners.    Differential diagnoses prior to evaluation: The emergent differential diagnosis includes, but is not limited to,  fractures, dislocations, intracranial bleed. This is not an exhaustive differential.   Past Medical History / Co-morbidities / Social History: seizure disorder on phenytoin, neuropathy, arthritis, cataracts  Physical Exam: Physical exam performed. The pertinent findings include: Patient in c-collar.  Lacerations noted to the lip.  Swelling of the right knee without other deformity or point tenderness.  Chest wall and pelvis stable.  Lab Tests/Imaging studies: I personally interpreted labs/imaging and the pertinent results include:  XR's of chest, pelvis, right hipp, CT's of head, c-spine, and maxillofacial without acute traumatic findings. I agree with the radiologist interpretation.  Medications: I ordered medication including tylenol and updated tetanus vaccination.  I have reviewed the patients home medicines and have made adjustments as needed.   Disposition: After consideration of the diagnostic results and the patients response to treatment, I feel that emergency department workup does not suggest an emergent condition requiring admission or immediate intervention beyond what has been performed at this time. The plan is:  discharge to home with symptomatic management of injuries after mechanical fall. Imaging unremarkable.  Wounds cleaned appropriately. The patient is safe for discharge and has been instructed to return immediately for worsening symptoms, change in symptoms or any other concerns.  Final Clinical Impression(s) / ED Diagnoses Final diagnoses:  Fall, initial encounter  Facial injury, initial encounter  Lip laceration, initial encounter    Rx / DC Orders ED Discharge Orders     None      Portions of this report may have been  transcribed using voice recognition software. Every effort was made to ensure accuracy; however, inadvertent computerized transcription errors may be present.    Varvara Legault T, PA-C 09/23/22 2140    Elayne Snare K, DO 09/23/22 2240

## 2022-09-23 NOTE — ED Triage Notes (Signed)
Pt BIB [facility] from home with c/o fall x 30 min ago. Pt was walking in parking lot of his apartment building and tripped over curb. Pt did hit head and endorses LOC. Not on thinners. Pt landed face first, breaking dentures. Swelling and laceration to lower lip; bleeding controlled. Ambulates with cane at baseline. Pt in C collar on arrival.  EMS Vitals:  BP 128/90 HR 90 O2 98% RR 20 CBG 160 18 ga L AC

## 2022-11-05 ENCOUNTER — Other Ambulatory Visit: Payer: Self-pay

## 2022-11-05 MED ORDER — PHENYTOIN SODIUM EXTENDED 100 MG PO CAPS: 100.0000 mg | ORAL_CAPSULE | Freq: Every day | ORAL | 4 refills | Status: DC

## 2023-03-01 IMAGING — US US BREAST*L* LIMITED INC AXILLA
1 series · 2 of 2 positions shown · non-contrast
Comparison: None available.

CLINICAL DATA: Patient describes a painful subareolar mass of the
LEFT breast, but now significantly improved after round of
antibiotics prescribed at a recent visit to the emergency room.

EXAM:
DIGITAL DIAGNOSTIC BILATERAL MAMMOGRAM WITH TOMOSYNTHESIS AND CAD;
ULTRASOUND LEFT BREAST LIMITED
TECHNIQUE: Bilateral digital diagnostic mammography and breast tomosynthesis
was performed. The images were evaluated with computer-aided
detection.; Targeted ultrasound examination of the left breast was
performed.

[Series 1: us breast*left* limited inc axilla · 0.07mm/px · 2 of 2 slices shown]
[im 1/2]
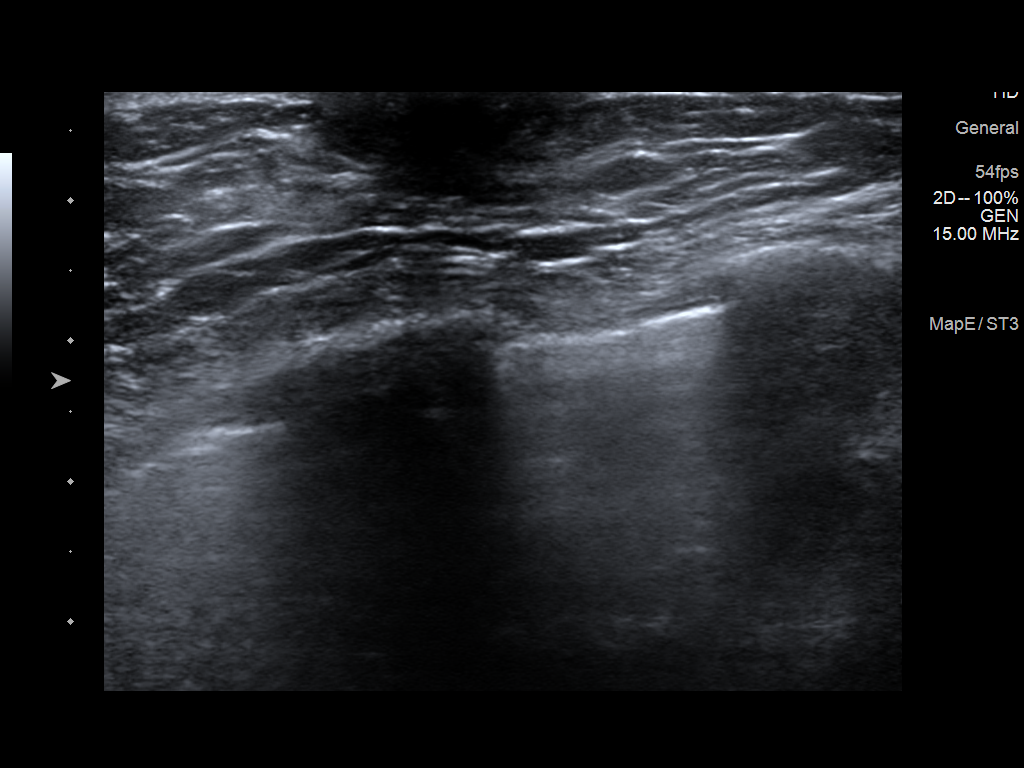
[im 2/2]
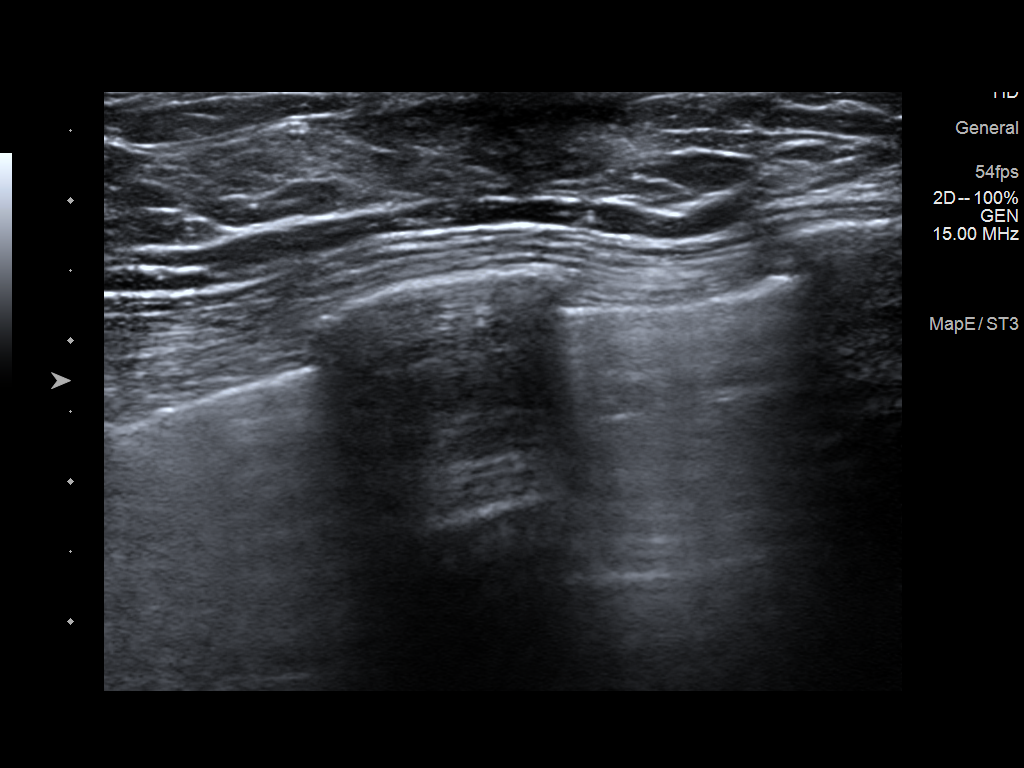

[2 of 2 positions shown; findings below may reference images not displayed]

ACR Breast Density Category b: There are scattered areas of
fibroglandular density.
FINDINGS: There is bilateral gynecomastia, mild to moderate in degree. No
dominant masses, suspicious calcifications or secondary signs of
malignancy are identified within either breast.

On physical exam, there is no skin redness or nipple retraction. No
fixed or circumscribed mass is palpated within the subareolar or
periareolar LEFT breast today.

Targeted ultrasound is performed, evaluating the subareolar and
periareolar LEFT breast, as directed by the patient, showing no
solid or cystic mass. No fluid collection or evidence of soft tissue
edema.
IMPRESSION: 1. Bilateral gynecomastia, mild to moderate in degree.
2. No evidence of malignancy or acute findings within either breast.

RECOMMENDATION:
I discussed with the patient the fact that gynecomastia can occur in
older men as testosterone levels decrease with age or in younger men
with low testosterone levels, causing a change in the serum
testosterone:estrogen ratio. We also discussed other potential
etiologies of gynecomastia including numerous prescription
medications, prostate cancer related medications/therapies and
chronic liver disease. Recreational drugs (marijuana and anabolic
steroids in particular) can also cause gynecomastia. Approximately
20% of cases of gynecomastia are idiopathic.

The patient was instructed to return for additional imaging if the
area that he feels becomes larger and/or firmer to palpation, or if
a new palpable abnormality is identified in either breast.

I have discussed the findings and recommendations with the patient.
If applicable, a reminder letter will be sent to the patient
regarding the next appointment.

BI-RADS CATEGORY  2: Benign.

## 2023-04-28 ENCOUNTER — Encounter (HOSPITAL_COMMUNITY): Payer: Self-pay | Admitting: *Deleted

## 2023-04-28 ENCOUNTER — Ambulatory Visit (HOSPITAL_COMMUNITY): Admission: EM | Admit: 2023-04-28 | Discharge: 2023-04-28 | Disposition: A | Discharge status: Home / Self Care

## 2023-04-28 DIAGNOSIS — S91209A Unspecified open wound of unspecified toe(s) with damage to nail, initial encounter: Secondary | ICD-10-CM

## 2023-04-28 MED ORDER — BACITRACIN ZINC 500 UNIT/GM EX OINT: TOPICAL_OINTMENT | Freq: Once | CUTANEOUS | Status: AC

## 2023-04-28 NOTE — Discharge Instructions (Signed)
 1. Avulsion of toenail, initial encounter (Primary) - bacitracin ointment applied to left great toe for infection prevention and appropriate healing - Apply dressing to left great toe in urgent care for protection and infection prevention -Continue to apply Betadine and warm water solution every night for 20 to 30 minutes to help with healing and prevent infection. -Minimize ambulation is much as possible to decrease risk for further injury.

## 2023-04-28 NOTE — ED Triage Notes (Signed)
 Pt states his left great toe was caught under an extension cord and he went forward 2 days ago. He has a small wound at the base of the toenail. He has been wrapping it with tape.

## 2023-04-28 NOTE — ED Provider Notes (Signed)
 [facility]  Note:  This document was prepared using Dragon voice recognition software and may include unintentional dictation errors.  MRN: [id number] DOB: October 05, 1940  Subjective:   Mario Waters is a 83 y.o. male presenting for injury to left great toenail x 2 days.  Patient reports that he got his toenail caught on extension cord while walking through his home.  Small wound noted to the cuticle at the medial aspect of the left great toe.  Patient has been applying tape to hold pressure and decrease risk for toenail removal.  Patient concerned because he is having pain with ambulation.  Denies any swelling, redness, warmth, drainage, fever.  No current facility-administered medications for this encounter.  Current Outpatient Medications:    acetaminophen (TYLENOL) 500 MG tablet, Take 500 mg by mouth every 6 (six) hours as needed., Disp: , Rfl:    alfuzosin (UROXATRAL) 10 MG 24 hr tablet, alfuzosin ER 10 mg tablet,extended release 24 hr  TAKE 1 TABLET BY MOUTH EVERY DAY, Disp: , Rfl:    atorvastatin (LIPITOR) 20 MG tablet, Take 20 mg by mouth at bedtime., Disp: , Rfl:    budesonide-formoterol (SYMBICORT) 80-4.5 MCG/ACT inhaler, Inhale 2 puffs into the lungs 2 (two) times daily., Disp: , Rfl:    Cholecalciferol (VITAMIN D) 2000 units CAPS, Take 2,000 Units by mouth daily., Disp: , Rfl:    clonazePAM (KLONOPIN) 0.5 MG tablet, clonazepam 0.5 mg tablet  TAKE 1 TABLET BY MOUTH AT BEDTIME AS NEEDED, Disp: , Rfl:    cyclobenzaprine (FLEXERIL) 5 MG tablet, Take 5 mg by mouth 2 (two) times daily as needed for muscle spasms., Disp: , Rfl:    levETIRAcetam (KEPPRA) 500 MG tablet, Take 1 tablet (500 mg total) by mouth 2 (two) times daily., Disp: 180 tablet, Rfl: 4   Multiple Vitamins-Minerals (PRESERVISION AREDS 2) CAPS, Take 2 capsules by mouth., Disp: , Rfl:    PENICILLIN V POTASSIUM PO, Take 1 capsule by mouth 2 (two) times daily., Disp: , Rfl:    phenytoin (DILANTIN) 100 MG ER  capsule, Take 1 capsule (100 mg total) by mouth at bedtime., Disp: 90 capsule, Rfl: 4   phenytoin (PHENYTOIN INFATABS) 50 MG tablet, CHEW AND SWALLOW 1 TABLET BY MOUTH AT BEDTIME, Disp: 90 tablet, Rfl: 4   tamsulosin (FLOMAX) 0.4 MG CAPS capsule, Take 0.4 mg by mouth daily., Disp: , Rfl:    enoxaparin (LOVENOX) 40 MG/0.4ML injection, Inject 0.4 mLs (40 mg total) into the skin daily., Disp: 13.6 mL, Rfl: 0   No Known Allergies  Past Medical History:  Diagnosis Date   Hip fracture (HCC) 02/2019   Neuropathy    Seizure disorder (HCC)    since 1990s. taking Dilantin 200mg  nightly   Seizures [facility])      Past Surgical History:  Procedure Laterality Date   CATARACT EXTRACTION, BILATERAL     HIP PINNING,CANNULATED Left 03/04/2019   Procedure: HIP PINNING;  Surgeon: Teryl Lucy, MD;  Location: [facility] OR;  Service: Orthopedics;  Laterality: Left;   KNEE SURGERY     PENECTOMY     REPAIR OF PERFORATED ULCER     SPINE SURGERY     Cervical laminectomy   TOE SURGERY      Family History  Problem Relation Age of Onset   Cancer Sister        breast   Cancer Sister    Seizures Neg Hx     Social History   Tobacco Use   Smoking status: Former  Current packs/day: 0.00    Types: Cigarettes    Quit date: 1980    Years since quitting: 45.2   Smokeless tobacco: Never  Vaping Use   Vaping status: Never Used  Substance Use Topics   Alcohol use: No    Comment: Quit 1980   Drug use: No    ROS Refer to HPI for ROS details.  Objective:   Vitals: BP 120/71 (BP Location: Right Arm)   Pulse 76   Temp (!) 97.4 F (36.3 C) (Oral)   Resp 16   SpO2 95%   Physical Exam Vitals and nursing note reviewed.  Constitutional:      General: He is not in acute distress.    Appearance: Normal appearance. He is well-developed. He is not ill-appearing or toxic-appearing.  HENT:     Head: Normocephalic.  Cardiovascular:     Rate and Rhythm: Normal rate.  Pulmonary:     Effort: Pulmonary effort  is normal. No respiratory distress.  Musculoskeletal:     Left foot: Normal range of motion. No deformity.  Feet:     Right foot:     Toenail Condition: Right toenails are abnormally thick and long. Fungal disease present.    Left foot:     Skin integrity: Callus and dry skin present. No blister, skin breakdown, erythema, warmth or fissure.     Toenail Condition: Left toenails are abnormally thick and long. Fungal disease present. Skin:    General: Skin is warm and dry.     Capillary Refill: Capillary refill takes less than 2 seconds.  Neurological:     General: No focal deficit present.     Mental Status: He is alert and oriented to person, place, and time.  Psychiatric:        Mood and Affect: Mood normal.        Behavior: Behavior normal.     Procedures  No results found for this or any previous visit (from the past 24 hours).  Assessment and Plan :   PDMP not reviewed this encounter.  1. Avulsion of toenail, initial encounter    1. Avulsion of toenail, initial encounter (Primary) - bacitracin ointment applied to left great toe for infection prevention and appropriate healing - Apply dressing to left great toe in urgent care for protection and infection prevention -Continue to apply Betadine and warm water solution every night for 20 to 30 minutes to help with healing and prevent infection. -Minimize ambulation is much as possible to decrease risk for further injury. -Continue to monitor symptoms for any change in severity if there is any escalation of current symptoms or development of new symptoms follow-up in ER for further evaluation and management.  Lucky Cowboy   Ferdinand, Thayer B, [place] 04/28/23 1425

## 2023-07-03 ENCOUNTER — Other Ambulatory Visit: Payer: Self-pay | Admitting: Neurology

## 2023-07-03 NOTE — Telephone Encounter (Signed)
 Phone room: Let pt know this will need to be prescribed by pcp.  Thanks,  Production assistant, radio

## 2023-07-03 NOTE — Telephone Encounter (Signed)
 Requested Prescriptions   Pending Prescriptions Disp Refills   clonazePAM  (KLONOPIN ) 0.5 MG tablet 30 tablet 5    Sig: Take 1 tablet (0.5 mg total) by mouth at bedtime.   Last seen 09/17/22, next appt 09/17/23  Dispenses   Dispensed Days Supply Quantity Provider Pharmacy  CLONAZEPAM  0.5MG  TABLETS 08/05/2022 30 30 each Toepfer, Olivia, PA-C [facility] #...      How do dispenses affect the score?   Clonazepam  looks like prescribed elsewhere. Is this something you wish to prescribe?

## 2023-07-03 NOTE — Telephone Encounter (Signed)
 Pt is requesting a refill for  clonazePAM  (KLONOPIN ) 0.5 MG tablet .  Pharmacy: [facility] [phone]

## 2023-09-17 ENCOUNTER — Encounter: Payer: Self-pay | Admitting: Neurology

## 2023-09-17 ENCOUNTER — Ambulatory Visit: Payer: Medicare HMO | Admitting: Neurology

## 2023-09-18 ENCOUNTER — Emergency Department (HOSPITAL_COMMUNITY)

## 2023-09-18 ENCOUNTER — Inpatient Hospital Stay (HOSPITAL_COMMUNITY)
Admission: EM | Admit: 2023-09-18 | Discharge: 2023-09-22 | DRG: 101 | Disposition: A | Discharge status: Home / Self Care | Attending: Family Medicine | Admitting: Family Medicine

## 2023-09-18 ENCOUNTER — Encounter (HOSPITAL_COMMUNITY): Payer: Self-pay

## 2023-09-18 ENCOUNTER — Other Ambulatory Visit: Payer: Self-pay

## 2023-09-18 ENCOUNTER — Inpatient Hospital Stay (HOSPITAL_COMMUNITY)

## 2023-09-18 DIAGNOSIS — Z9842 Cataract extraction status, left eye: Secondary | ICD-10-CM

## 2023-09-18 DIAGNOSIS — E876 Hypokalemia: Secondary | ICD-10-CM | POA: Diagnosis present

## 2023-09-18 DIAGNOSIS — M6282 Rhabdomyolysis: Secondary | ICD-10-CM | POA: Diagnosis present

## 2023-09-18 DIAGNOSIS — Z91148 Patient's other noncompliance with medication regimen for other reason: Secondary | ICD-10-CM | POA: Diagnosis not present

## 2023-09-18 DIAGNOSIS — Z79899 Other long term (current) drug therapy: Secondary | ICD-10-CM

## 2023-09-18 DIAGNOSIS — G40909 Epilepsy, unspecified, not intractable, without status epilepticus: Secondary | ICD-10-CM | POA: Diagnosis not present

## 2023-09-18 DIAGNOSIS — D72829 Elevated white blood cell count, unspecified: Secondary | ICD-10-CM | POA: Diagnosis present

## 2023-09-18 DIAGNOSIS — G894 Chronic pain syndrome: Secondary | ICD-10-CM | POA: Diagnosis present

## 2023-09-18 DIAGNOSIS — Z7951 Long term (current) use of inhaled steroids: Secondary | ICD-10-CM | POA: Diagnosis not present

## 2023-09-18 DIAGNOSIS — N400 Enlarged prostate without lower urinary tract symptoms: Secondary | ICD-10-CM | POA: Diagnosis present

## 2023-09-18 DIAGNOSIS — Z87891 Personal history of nicotine dependence: Secondary | ICD-10-CM | POA: Diagnosis not present

## 2023-09-18 DIAGNOSIS — R569 Unspecified convulsions: Secondary | ICD-10-CM | POA: Diagnosis not present

## 2023-09-18 DIAGNOSIS — Z79890 Hormone replacement therapy (postmenopausal): Secondary | ICD-10-CM | POA: Diagnosis not present

## 2023-09-18 DIAGNOSIS — Z9841 Cataract extraction status, right eye: Secondary | ICD-10-CM

## 2023-09-18 DIAGNOSIS — E785 Hyperlipidemia, unspecified: Secondary | ICD-10-CM | POA: Diagnosis present

## 2023-09-18 DIAGNOSIS — G629 Polyneuropathy, unspecified: Secondary | ICD-10-CM | POA: Diagnosis present

## 2023-09-18 DIAGNOSIS — G40901 Epilepsy, unspecified, not intractable, with status epilepticus: Principal | ICD-10-CM | POA: Diagnosis present

## 2023-09-18 DIAGNOSIS — R748 Abnormal levels of other serum enzymes: Secondary | ICD-10-CM | POA: Diagnosis present

## 2023-09-18 LAB — CBG MONITORING, ED: Glucose-Capillary: 167 mg/dL — ABNORMAL HIGH (ref 70–99)

## 2023-09-18 LAB — CBC WITH DIFFERENTIAL/PLATELET
Abs Immature Granulocytes: 0.12 K/uL — ABNORMAL HIGH (ref 0.00–0.07)
Basophils Absolute: 0 K/uL (ref 0.0–0.1)
Basophils Relative: 0 %
Eosinophils Absolute: 0 K/uL (ref 0.0–0.5)
Eosinophils Relative: 0 %
HCT: 44 % (ref 39.0–52.0)
Hemoglobin: 15.2 g/dL (ref 13.0–17.0)
Immature Granulocytes: 1 %
Lymphocytes Relative: 6 %
Lymphs Abs: 0.8 K/uL (ref 0.7–4.0)
MCH: 32.4 pg (ref 26.0–34.0)
MCHC: 34.5 g/dL (ref 30.0–36.0)
MCV: 93.8 fL (ref 80.0–100.0)
Monocytes Absolute: 1.1 K/uL — ABNORMAL HIGH (ref 0.1–1.0)
Monocytes Relative: 8 %
Neutro Abs: 11.9 K/uL — ABNORMAL HIGH (ref 1.7–7.7)
Neutrophils Relative %: 85 %
Platelets: 281 K/uL (ref 150–400)
RBC: 4.69 MIL/uL (ref 4.22–5.81)
RDW: 13.2 % (ref 11.5–15.5)
WBC: 13.9 K/uL — ABNORMAL HIGH (ref 4.0–10.5)
nRBC: 0 % (ref 0.0–0.2)

## 2023-09-18 LAB — COMPREHENSIVE METABOLIC PANEL WITH GFR
ALT: 47 U/L — ABNORMAL HIGH (ref 0–44)
AST: 60 U/L — ABNORMAL HIGH (ref 15–41)
Albumin: 4.5 g/dL (ref 3.5–5.0)
Alkaline Phosphatase: 74 U/L (ref 38–126)
Anion gap: 17 — ABNORMAL HIGH (ref 5–15)
BUN: 10 mg/dL (ref 8–23)
CO2: 26 mmol/L (ref 22–32)
Calcium: 9.6 mg/dL (ref 8.9–10.3)
Chloride: 94 mmol/L — ABNORMAL LOW (ref 98–111)
Creatinine, Ser: 0.85 mg/dL (ref 0.61–1.24)
GFR, Estimated: >60 mL/min (ref >60)
Glucose, Bld: 159 mg/dL — ABNORMAL HIGH (ref 70–99)
Potassium: 4.1 mmol/L (ref 3.5–5.1)
Sodium: 137 mmol/L (ref 135–145)
Total Bilirubin: 1.1 mg/dL (ref 0.0–1.2)
Total Protein: 7.9 g/dL (ref 6.5–8.1)

## 2023-09-18 LAB — PHENYTOIN LEVEL, TOTAL: Phenytoin Lvl: 7.1 ug/mL — ABNORMAL LOW (ref 10.0–20.0)

## 2023-09-18 LAB — CK: Total CK: 2375 U/L — ABNORMAL HIGH (ref 49–397)

## 2023-09-18 LAB — ETHANOL: Alcohol, Ethyl (B): <15 mg/dL (ref <15)

## 2023-09-18 MED ORDER — ALFUZOSIN HCL ER 10 MG PO TB24
10.0000 mg | ORAL_TABLET | Freq: Every day | ORAL | Status: DC
  Administered 2023-09-19 – 2023-09-22 (×4): 10 mg via ORAL
  Filled 2023-09-18 (×4): qty 1

## 2023-09-18 MED ORDER — ONDANSETRON HCL 4 MG PO TABS: 4.0000 mg | ORAL_TABLET | Freq: Four times a day (QID) | ORAL | Status: DC | PRN

## 2023-09-18 MED ORDER — LORAZEPAM 2 MG/ML IJ SOLN
INTRAMUSCULAR | Status: AC
  Administered 2023-09-18: 2 mg
  Filled 2023-09-18: qty 1

## 2023-09-18 MED ORDER — ONDANSETRON HCL 4 MG/2ML IJ SOLN: 4.0000 mg | Freq: Four times a day (QID) | INTRAMUSCULAR | Status: DC | PRN

## 2023-09-18 MED ORDER — LEVETIRACETAM (KEPPRA) 500 MG/5 ML ADULT IV PUSH
1000.0000 mg | Freq: Once | INTRAVENOUS | Status: AC
  Administered 2023-09-18: 1000 mg via INTRAVENOUS
  Filled 2023-09-18: qty 10

## 2023-09-18 MED ORDER — SENNOSIDES-DOCUSATE SODIUM 8.6-50 MG PO TABS: 1.0000 | ORAL_TABLET | Freq: Every evening | ORAL | Status: DC | PRN

## 2023-09-18 MED ORDER — MIDAZOLAM HCL 2 MG/2ML IJ SOLN: 4.0000 mg | INTRAMUSCULAR | Status: DC | PRN

## 2023-09-18 MED ORDER — LEVETIRACETAM 500 MG PO TABS: 500.0000 mg | ORAL_TABLET | Freq: Two times a day (BID) | ORAL | Status: DC

## 2023-09-18 MED ORDER — ACETAMINOPHEN 325 MG PO TABS
650.0000 mg | ORAL_TABLET | ORAL | Status: DC | PRN
Start: 2023-09-18 — End: 2023-09-22
  Filled 2023-09-18: qty 2

## 2023-09-18 MED ORDER — LACTATED RINGERS IV SOLN: INTRAVENOUS | Status: AC

## 2023-09-18 MED ORDER — ENOXAPARIN SODIUM 40 MG/0.4ML IJ SOSY
40.0000 mg | PREFILLED_SYRINGE | INTRAMUSCULAR | Status: DC
  Administered 2023-09-18 – 2023-09-21 (×4): 40 mg via SUBCUTANEOUS
  Filled 2023-09-18 (×4): qty 0.4

## 2023-09-18 MED ORDER — PANTOPRAZOLE SODIUM 20 MG PO TBEC
20.0000 mg | DELAYED_RELEASE_TABLET | Freq: Every day | ORAL | Status: DC
Start: 2023-09-19 — End: 2023-09-22
  Administered 2023-09-19 – 2023-09-22 (×4): 20 mg via ORAL
  Filled 2023-09-18 (×4): qty 1

## 2023-09-18 MED ORDER — LEVETIRACETAM (KEPPRA) 500 MG/5 ML ADULT IV PUSH
20.0000 mg/kg | Freq: Once | INTRAVENOUS | Status: AC
  Administered 2023-09-18: 1250 mg via INTRAVENOUS
  Filled 2023-09-18: qty 15

## 2023-09-18 MED ORDER — SODIUM CHLORIDE 0.9 % IV BOLUS
1000.0000 mL | Freq: Once | INTRAVENOUS | Status: AC
  Administered 2023-09-18: 1000 mL via INTRAVENOUS

## 2023-09-18 MED ORDER — LEVETIRACETAM (KEPPRA) 500 MG/5 ML ADULT IV PUSH
2750.0000 mg | Freq: Once | INTRAVENOUS | Status: AC
  Administered 2023-09-18: 2750 mg via INTRAVENOUS
  Filled 2023-09-18: qty 30

## 2023-09-18 MED ORDER — ACETAMINOPHEN 650 MG RE SUPP
650.0000 mg | RECTAL | Status: DC | PRN
Start: 2023-09-18 — End: 2023-09-22

## 2023-09-18 MED ORDER — PHENYTOIN SODIUM EXTENDED 100 MG PO CAPS
100.0000 mg | ORAL_CAPSULE | Freq: Every day | ORAL | Status: DC
Start: 2023-09-19 — End: 2023-09-19

## 2023-09-18 MED ORDER — TAMSULOSIN HCL 0.4 MG PO CAPS
0.4000 mg | ORAL_CAPSULE | Freq: Every day | ORAL | Status: DC
Start: 2023-09-19 — End: 2023-09-22
  Administered 2023-09-19 – 2023-09-22 (×4): 0.4 mg via ORAL
  Filled 2023-09-18 (×4): qty 1

## 2023-09-18 MED ORDER — LORAZEPAM 2 MG/ML IJ SOLN
2.0000 mg | Freq: Once | INTRAMUSCULAR | Status: DC
  Filled 2023-09-18: qty 1

## 2023-09-18 MED ORDER — LORAZEPAM 2 MG/ML IJ SOLN: 2.0000 mg | INTRAMUSCULAR | Status: DC | PRN

## 2023-09-18 NOTE — ED Notes (Signed)
 EDP on the phone speaking with family.

## 2023-09-18 NOTE — ED Notes (Signed)
 Patient transported to MRI

## 2023-09-18 NOTE — Hospital Course (Addendum)
 suggestive of moderate diffuse encephalopathy. No seizures or epileptiform discharges were seen throughout the recording.

## 2023-09-18 NOTE — ED Notes (Signed)
 Attempted I/O cath. Unable to pass catheter condom cath applied.

## 2023-09-18 NOTE — Progress Notes (Signed)
 Study back up and running, at[facility] notified and test button pushed

## 2023-09-18 NOTE — H&P (Signed)
 History and Physical    Mario Waters FMW:[id number] DOB: 09/20/1940 DOA: 09/18/2023  PCP: Haze Kingfisher, MD  Patient coming from: Home  I have personally briefly reviewed patient's old medical records in [facility]  Chief Complaint: Seizures  HPI: Mario Waters is a 83 y.o. male with medical history significant for seizure disorder, HLD, BPH, chronic pain syndrome who presented to the ED for evaluation of seizures.  Patient unable to provide history which is otherwise supplemented by EDP and chart review.  Patient reportedly found on floor at home by family after suspected unwitnessed seizure.  Initially he did not want not on call.  He subsequently had 3 witnessed seizure episodes for several minutes at a time with a few minutes in between each episode.  He was unresponsive after the second seizure.  Reportedly his last seizure was over 6 months ago.  Per ED documentation, EMS reported that patient was contracted in had increased tone.  He was given 2.5 mg of Versed  and brought to the ED for further evaluation.  Patient remains postictal at time of immediate evaluation.  Grimaces with noxious stimuli and withdraws all extremities.  ED Course  Labs/Imaging on admission: I have personally reviewed following labs and imaging studies.  Initial vitals showed BP 140/99, pulse 101, RR 20, temp 99.4 F, SpO2 100% on room air.  Labs showed WBC 13.9, hemoglobin 15.2, platelets 281, sodium 137, potassium 4.1, bicarb 26, BUN 10, creatinine 0.85, serum glucose 159, AST 60, ALT 47, alk phos 74, total bilirubin 1.1, serum ethanol <15.  CK 2375.  Phenytoin  level low at 7.1.  Keppra  level in process.  UA and UDS pending collection.  CT head without contrast negative for acute intracranial normality.  Atrophy, chronic microvascular disease noted.  CT cervical spine without contrast negative for acute bony abnormality.  Degenerative and postoperative changes noted.  MRI brain without  contrast negative for acute intracranial normality.  Patient was given IV Ativan  2 mg and loaded with IV Keppra  4 g.  He was given 1 L normal saline.  Neurology consulted and recommended medical admission, overnight EEG.  The hospitalist service was consulted to admit.  Review of Systems: All systems reviewed and are negative except as documented in history of present illness above.   Past Medical History:  Diagnosis Date   Hip fracture (HCC) 02/2019   Neuropathy    Seizure disorder (HCC)    since 1990s. taking Dilantin  200mg  nightly   Seizures (HCC)     Past Surgical History:  Procedure Laterality Date   CATARACT EXTRACTION, BILATERAL     HIP PINNING,CANNULATED Left 03/04/2019   Procedure: HIP PINNING;  Surgeon: Josefina Chew, MD;  Location: [facility] OR;  Service: Orthopedics;  Laterality: Left;   KNEE SURGERY     PENECTOMY     REPAIR OF PERFORATED ULCER     SPINE SURGERY     Cervical laminectomy   TOE SURGERY      Social History: Social History   Tobacco Use   Smoking status: Former    Current packs/day: 0.00    Types: Cigarettes    Quit date: 1980    Years since quitting: 45.6   Smokeless tobacco: Never  Vaping Use   Vaping status: Never Used  Substance Use Topics   Alcohol use: No    Comment: Quit 1980   Drug use: No   No Known Allergies  Family History  Problem Relation Age of Onset   Cancer Sister  breast   Cancer Sister    Seizures Neg Hx      Prior to Admission medications   Medication Sig Start Date End Date Taking? Authorizing Provider  acetaminophen  (TYLENOL ) 500 MG tablet Take 500 mg by mouth every 6 (six) hours as needed for mild pain (pain score 1-3).   Yes [provider]  alfuzosin  (UROXATRAL ) 10 MG 24 hr tablet alfuzosin  ER 10 mg tablet,extended release 24 hr  TAKE 1 TABLET BY MOUTH EVERY DAY   Yes [provider]  atorvastatin  (LIPITOR) 40 MG tablet Take 40 mg by mouth daily.   Yes [provider]   budesonide-formoterol (SYMBICORT) 80-4.5 MCG/ACT inhaler Inhale 2 puffs into the lungs 2 (two) times daily.   Yes [provider]  ergocalciferol (VITAMIN D2) 1.25 MG (50000 UT) capsule Take 50,000 Units by mouth once a week.   Yes [provider]  gabapentin (NEURONTIN) 300 MG capsule Take 300 mg by mouth 3 (three) times daily as needed (for pain).   Yes [provider]  HYDROcodone -acetaminophen  (NORCO/VICODIN) 5-325 MG tablet Take 1 tablet by mouth every 6 (six) hours as needed for moderate pain (pain score 4-6).   Yes [provider]  levETIRAcetam  (KEPPRA ) 500 MG tablet Take 1 tablet (500 mg total) by mouth 2 (two) times daily. 09/17/22  Yes Gayland Lauraine PARAS, NP  Multiple Vitamins-Minerals (PRESERVISION AREDS 2) CAPS Take 2 capsules by mouth.   Yes [provider]  pantoprazole  (PROTONIX ) 20 MG tablet Take 20 mg by mouth daily. 08/13/23  Yes [provider]  phenytoin  (DILANTIN ) 100 MG ER capsule Take 1 capsule (100 mg total) by mouth at bedtime. 11/05/22  Yes Gayland Lauraine PARAS, NP  tamsulosin  (FLOMAX ) 0.4 MG CAPS capsule Take 0.4 mg by mouth daily. 09/18/21  Yes [provider]  traMADol  (ULTRAM ) 50 MG tablet Take 50 mg by mouth 4 (four) times daily as needed for moderate pain (pain score 4-6). 05/08/23  Yes [provider]  cyclobenzaprine (FLEXERIL) 5 MG tablet Take 5 mg by mouth 2 (two) times daily as needed for muscle spasms. Patient not taking: Reported on 09/18/2023    [provider]  enoxaparin  (LOVENOX ) 40 MG/0.4ML injection Inject 0.4 mLs (40 mg total) into the skin daily. 03/06/19 04/09/19  Kelby Schatz, MD  phenytoin  (PHENYTOIN  INFATABS) 50 MG tablet CHEW AND SWALLOW 1 TABLET BY MOUTH AT BEDTIME Patient not taking: Reported on 09/18/2023 09/17/22   Gayland Lauraine PARAS, NP    Physical Exam: Vitals:   09/18/23 1830 09/18/23 1845 09/18/23 1915 09/18/23 1930  BP: (!) 147/68 134/66 135/62 (!) 144/69  Pulse: 84 96 95  94  Resp: (!) 21 (!) 29 (!) 22 (!) 22  Temp:      TempSrc:      SpO2: 99% 100% 99% 99%   Constitutional: Resting in bed, somnolent Eyes:  lids and conjunctivae normal ENMT: Mucous membranes are dry. Posterior pharynx clear of any exudate or lesions.Normal dentition.  Neck: normal, supple, no masses. Respiratory: clear to auscultation and serially. Normal respiratory effort. No accessory muscle use.  Cardiovascular: Regular rate and rhythm, no murmurs / rubs / gallops. No extremity edema. 2+ pedal pulses. Abdomen: no tenderness, no masses palpated. Musculoskeletal: no clubbing / cyanosis. No joint deformity upper and lower extremities. Good ROM, no contractures.  Thin extremities. Skin: no rashes, lesions, ulcers. No induration Neurologic: Postictal, grimaces to noxious stimuli, moves all extremities spontaneously and withdraws extremities to noxious stimuli Psychiatric: Postictal  EKG: Personally  reviewed. Motion artifact limits interpretation, appears to be sinus rhythm, rate 97.  Assessment/Plan Principal Problem:   Seizure (HCC) Active Problems:   Benign prostatic hyperplasia   Elevated CK   Leukocytosis   Hyperlipidemia   Chronic pain syndrome   Mario Waters is a 83 y.o. male with medical history significant for seizure disorder, HLD, BPH, chronic pain syndrome who is admitted with seizure activity.  Assessment and Plan: Seizure disorder: Patient admitted with breakthrough seizures.  Question medication adherence.  Phenytoin  level is low, Keppra  level in process.  Received 4 g IV Keppra  load and IV Ativan  2 mg in the ED. - Neurology following - Overnight EEG ordered - Will resume home Keppra  and phenytoin ; further adjustments as per neurology - Seizure precautions  Elevated CK: Secondary to seizure activity.  Continue IV fluid hydration and repeat labs in AM.  Leukocytosis: Likely reactive process.  Continue to monitor.  Hyperlipidemia: Hold statin pending repeat  CMP in AM.  Chronic pain syndrome/neuropathy: Per PDMP review patient current pain regimen consists of hydrocodone -APAP 5-325 mg #180/month since May 2025; last filled 09/04/2023.  Also prescribed gabapentin.  These are on hold until mentation improves.  BPH: Continue Flomax  and alfuzosin .   DVT prophylaxis: enoxaparin  (LOVENOX ) injection 40 mg Start: 09/18/23 1945 Code Status: Full code Family Communication: None present on admission Disposition Plan: Pending clinical progress Consults called: Neurology Severity of Illness: The appropriate patient status for this patient is INPATIENT. Inpatient status is judged to be reasonable and necessary in order to provide the required intensity of service to ensure the patient's safety. The patient's presenting symptoms, physical exam findings, and initial radiographic and laboratory data in the context of their chronic comorbidities is felt to place them at high risk for further clinical deterioration. Furthermore, it is not anticipated that the patient will be medically stable for discharge from the hospital within 2 midnights of admission.   * I certify that at the point of admission it is my clinical judgment that the patient will require inpatient hospital care spanning beyond 2 midnights from the point of admission due to high intensity of service, high risk for further deterioration and high frequency of surveillance required.Mario Waters Jorie Blanch MD [facility]  If 7PM-7AM, please contact night-coverage [email or web address]  09/18/2023, 7:43 PM

## 2023-09-18 NOTE — ED Notes (Signed)
 CCMD called and notified

## 2023-09-18 NOTE — ED Notes (Signed)
 EEG at bedside.

## 2023-09-18 NOTE — Consult Note (Signed)
 NEUROLOGY CONSULT NOTE   Date of service: September 18, 2023 Patient Name: Mario Waters MRN:  [id number] DOB:  11-04-1940 Chief Complaint: Seizures Requesting Provider: Yolande Lamar BROCKS, MD  History of Present Illness  Mario Waters is a 83 y.o. male with hx of seizures on Dilantin  and Keppra , memory deficit and neuropathy who presents to [facility] Ed via EMS for having multiple seizures today. Per RN at the bedside, family reported he had 3 seizures and patient refused to come to the ED, however son found him unresponsive and covered in emesis which prompted him to call 911. Per RN family is not sure if patient has missed any doses of his AEDs. No family at the bedside. RN states he received 2.5mg  of midazolam  with EMS and then 2 mg ativan  in the Ed. On arrival RN states he had right upper arm twitching and rigidity. He received 4000mg  IV Keppra  in the ED. Keppra  level sent. Phenytoin  level 7.1    ROS  Comprehensive ROS Unable to ascertain due to AMS  Past History   Past Medical History:  Diagnosis Date   Hip fracture (HCC) 02/2019   Neuropathy    Seizure disorder (HCC)    since 1990s. taking Dilantin  200mg  nightly   Seizures [facility])     Past Surgical History:  Procedure Laterality Date   CATARACT EXTRACTION, BILATERAL     HIP PINNING,CANNULATED Left 03/04/2019   Procedure: HIP PINNING;  Surgeon: Josefina Chew, MD;  Location: [facility] OR;  Service: Orthopedics;  Laterality: Left;   KNEE SURGERY     PENECTOMY     REPAIR OF PERFORATED ULCER     SPINE SURGERY     Cervical laminectomy   TOE SURGERY      Family History: Family History  Problem Relation Age of Onset   Cancer Sister        breast   Cancer Sister    Seizures Neg Hx     Social History  reports that he quit smoking about 45 years ago. His smoking use included cigarettes. He has never used smokeless tobacco. He reports that he does not drink alcohol and does not use drugs.  No Known Allergies  Medications  No current  facility-administered medications for this encounter.  Current Outpatient Medications:    acetaminophen  (TYLENOL ) 500 MG tablet, Take 500 mg by mouth every 6 (six) hours as needed for mild pain (pain score 1-3)., Disp: , Rfl:    alfuzosin  (UROXATRAL ) 10 MG 24 hr tablet, alfuzosin  ER 10 mg tablet,extended release 24 hr  TAKE 1 TABLET BY MOUTH EVERY DAY, Disp: , Rfl:    atorvastatin  (LIPITOR) 40 MG tablet, Take 40 mg by mouth daily., Disp: , Rfl:    budesonide-formoterol (SYMBICORT) 80-4.5 MCG/ACT inhaler, Inhale 2 puffs into the lungs 2 (two) times daily., Disp: , Rfl:    ergocalciferol (VITAMIN D2) 1.25 MG (50000 UT) capsule, Take 50,000 Units by mouth once a week., Disp: , Rfl:    gabapentin (NEURONTIN) 300 MG capsule, Take 300 mg by mouth 3 (three) times daily as needed (for pain)., Disp: , Rfl:    HYDROcodone -acetaminophen  (NORCO/VICODIN) 5-325 MG tablet, Take 1 tablet by mouth every 6 (six) hours as needed for moderate pain (pain score 4-6)., Disp: , Rfl:    levETIRAcetam  (KEPPRA ) 500 MG tablet, Take 1 tablet (500 mg total) by mouth 2 (two) times daily., Disp: 180 tablet, Rfl: 4   Multiple Vitamins-Minerals (PRESERVISION AREDS 2) CAPS, Take 2 capsules by mouth., Disp: , Rfl:  pantoprazole  (PROTONIX ) 20 MG tablet, Take 20 mg by mouth daily., Disp: , Rfl:    phenytoin  (DILANTIN ) 100 MG ER capsule, Take 1 capsule (100 mg total) by mouth at bedtime., Disp: 90 capsule, Rfl: 4   tamsulosin  (FLOMAX ) 0.4 MG CAPS capsule, Take 0.4 mg by mouth daily., Disp: , Rfl:    traMADol  (ULTRAM ) 50 MG tablet, Take 50 mg by mouth 4 (four) times daily as needed for moderate pain (pain score 4-6)., Disp: , Rfl:    cyclobenzaprine (FLEXERIL) 5 MG tablet, Take 5 mg by mouth 2 (two) times daily as needed for muscle spasms. (Patient not taking: Reported on 2023/09/29), Disp: , Rfl:    enoxaparin  (LOVENOX ) 40 MG/0.4ML injection, Inject 0.4 mLs (40 mg total) into the skin daily., Disp: 13.6 mL, Rfl: 0   phenytoin   (PHENYTOIN  INFATABS) 50 MG tablet, CHEW AND SWALLOW 1 TABLET BY MOUTH AT BEDTIME, Disp: 90 tablet, Rfl: 4  Vitals   Vitals:   09-29-23 1345  BP: (!) 140/99  Pulse: (!) 102  Resp: 20  Temp: 99.4 F (37.4 C)  TempSrc: Temporal  SpO2: 100%    There is no height or weight on file to calculate BMI.   Physical Exam   Constitutional: elderly african american male  Eyes: No scleral injection.  HENT: No OP obstruction.  Head: Normocephalic.  Cardiovascular: Normal rate and regular rhythm.  Respiratory: Effort normal, non-labored breathing.  GI: Soft.  No distension. There is no tenderness.  Skin: WDI.   Neurologic Examination   Mental Status -  Patient is lethargic, eyes are closed, does not open eyes to voice or command. Not speaking or following commands. Eyes are midline, PERRL, minimal blink to threat bilaterally, face appears symmetric, withdraws in all 4 extremities, Responds to noxious stimuli in all 4 extremities unable to assess coordination, gait deferred   Labs/Imaging/Neurodiagnostic studies   CBC:  Recent Labs  Lab 09-29-23 1347  WBC 13.9*  NEUTROABS 11.9*  HGB 15.2  HCT 44.0  MCV 93.8  PLT 281   Basic Metabolic Panel:  Lab Results  Component Value Date   NA 137 2023/09/29   K 4.1 September 29, 2023   CO2 26 2023/09/29   GLUCOSE 159 (H) 09-29-23   BUN 10 09-29-23   CREATININE 0.85 09-29-23   CALCIUM  9.6 2023-09-29   GFRNONAA >60 Sep 29, 2023   GFRAA 92 09/20/2019   Lipid Panel: No results found for: LDLCALC HgbA1c: No results found for: HGBA1C Urine Drug Screen: No results found for: LABOPIA, COCAINSCRNUR, LABBENZ, AMPHETMU, THCU, LABBARB  Alcohol Level     Component Value Date/Time   ETH <15 09-29-23 1358   INR  Lab Results  Component Value Date   INR 1.2 03/03/2019   APTT No results found for: APTT AED levels:  Lab Results  Component Value Date   PHENYTOIN  7.1 (L) Sep 29, 2023   LEVETIRACETA 28.9 09/17/2022    CT Head  without contrast(Personally reviewed): No acute process  MRI Brain(Personally reviewed): No acute process  Neurodiagnostics LTM EEG:  ordered  ASSESSMENT   Mario Waters is a 83 y.o. male  hx of seizures on Dilantin  and Keppra , memory deficit and neuropathy who presents to [facility] Ed via EMS for having multiple seizures today. Per RN at the bedside, family reported he had 3 seizures and patient refused to come to the ED, however son found him unresponsive and covered in emesis which prompted him to call 911. Phenytoin  level 7.1  RECOMMENDATIONS  - Seizure precautions - LTM EEG ASAP -  MRI brain  - UDS - Evaluate for infection   ______________________________________________________________________    Signed, Karna DELENA Geralds, NP [facility]

## 2023-09-18 NOTE — ED Triage Notes (Signed)
 Pt BIB [facility] from home d/t unwitnessed seizures while at home with family. Family told EMS he did not want to come after the first one today & was unable to report when each happened but the son reports when he found his unresponsive & covered in coffee ground colored emesis approx. 1.5 hr before arrival to ED he decided to call 911. Pt does take Keppra , unknown if any doses have been missed. EMS gave pt 2.5 midazolam  via 22g Rt hand. & at time of arrival reports he was blinking his eyes & squeezing hands on demand. Did not have any full body seizures with them. 161/77, 96 bpm, 99% on 3L O2, CO2 29, 21 resp, CBG 118.

## 2023-09-18 NOTE — Progress Notes (Signed)
 LTM EEG hooked up and running - no initial skin breakdown - moving pt to floor, will call atrium once pt settled

## 2023-09-18 NOTE — ED Notes (Signed)
 Upon arrival pt had begun to respond verbal to EDP and was moving arms. At 1358 twitching noticed in right arm while EDP was at bedside.

## 2023-09-18 NOTE — ED Provider Notes (Signed)
  Physical Exam  BP 134/66   Pulse 96   Temp 99.6 F (37.6 C) (Axillary)   Resp (!) 29   SpO2 100%   Physical Exam Vitals and nursing note reviewed.  HENT:     Head: Normocephalic and atraumatic.  Eyes:     Pupils: Pupils are equal, round, and reactive to light.  Cardiovascular:     Rate and Rhythm: Normal rate and regular rhythm.  Pulmonary:     Effort: Pulmonary effort is normal.     Breath sounds: Normal breath sounds.  Abdominal:     Palpations: Abdomen is soft.     Tenderness: There is no abdominal tenderness.  Skin:    General: Skin is warm and dry.  Neurological:     Mental Status: He is alert.     Comments: Somnolent but responds to painful stimuli  Psychiatric:        Mood and Affect: Mood normal.     Procedures  Procedures  ED Course / MDM   Clinical Course as of 09/18/23 1920  Thu Sep 18, 2023  1433 Dr Matthews from neurology consulted.  Recommends Keppra  load.  They will come evaluate the patient for EEG [RP]  1600 Signed out to Dr. Pamella [RP]  1919 MRI shows no acute abnormality.  Patient remains postictal but seems to be coming around.  Awaiting final results of EEG.  Neuro NP Waddell recommends admission to hospitalist service and neurology team will follow as consult.  Discussed admitting hospitalist who accepts patient for admission [MP]    Clinical Course User Index [MP] Pamella Ozell LABOR, DO [RP] Yolande Lamar BROCKS, MD   Medical Decision Making I, Ozell Pamella DO, have assumed care of this patient from the previous provider pending MRI, EEG and admission  Amount and/or Complexity of Data Reviewed Labs: ordered. Radiology: ordered.  Risk Decision regarding hospitalization.          Pamella Ozell LABOR, DO 09/18/23 RENARD

## 2023-09-18 NOTE — Progress Notes (Signed)
 Mb stopped by ED after last ED patient to find this patient was not in the TRACC room and is still in MRI per charge nurse and Nurse. I went ahead and set up equipment in room for an LTM EEG after MRI. Third shift is due in approximately 5 minutes and will take on the task of hook up as schedule permits.

## 2023-09-18 NOTE — ED Notes (Signed)
 Patient transported to CT

## 2023-09-18 NOTE — ED Provider Notes (Addendum)
 [place] EMERGENCY DEPARTMENT AT [facility] Provider Note   CSN: [id number] Arrival date & time: 09/18/23  1341     Patient presents with: No chief complaint on file.   Mario Waters is a 83 y.o. male.   83 yo with hx of seizures who presents with seizure. Was found on the floor and had a possible seizure. Didn't want 911 called. Had a 3 witnessed seizure for several minutes at a time with a few minutes in between. Didn't respond between the second two seizures. Did injure his toe. Last seizure was 6 months or more. EMS reported he was contacted and he had increased tone and gave him 2.5 mg of versed . Has missed a dose of his seizure meds this week. No etoh or drugs. Hx obtained via ems and son.        Prior to Admission medications   Medication Sig Start Date End Date Taking? Authorizing Provider  acetaminophen  (TYLENOL ) 500 MG tablet Take 500 mg by mouth every 6 (six) hours as needed for mild pain (pain score 1-3).   Yes [provider]  alfuzosin  (UROXATRAL ) 10 MG 24 hr tablet alfuzosin  ER 10 mg tablet,extended release 24 hr  TAKE 1 TABLET BY MOUTH EVERY DAY   Yes [provider]  atorvastatin  (LIPITOR) 40 MG tablet Take 40 mg by mouth daily.   Yes [provider]  budesonide-formoterol (SYMBICORT) 80-4.5 MCG/ACT inhaler Inhale 2 puffs into the lungs 2 (two) times daily.   Yes [provider]  ergocalciferol (VITAMIN D2) 1.25 MG (50000 UT) capsule Take 50,000 Units by mouth once a week.   Yes [provider]  gabapentin (NEURONTIN) 300 MG capsule Take 300 mg by mouth 3 (three) times daily as needed (for pain).   Yes [provider]  HYDROcodone -acetaminophen  (NORCO/VICODIN) 5-325 MG tablet Take 1 tablet by mouth every 6 (six) hours as needed for moderate pain (pain score 4-6).   Yes [provider]  levETIRAcetam  (KEPPRA ) 500 MG tablet Take 1 tablet (500 mg total) by mouth 2 (two) times daily. 09/17/22  Yes  Gayland Lauraine PARAS, NP  Multiple Vitamins-Minerals (PRESERVISION AREDS 2) CAPS Take 2 capsules by mouth.   Yes [provider]  pantoprazole  (PROTONIX ) 20 MG tablet Take 20 mg by mouth daily. 08/13/23  Yes [provider]  phenytoin  (DILANTIN ) 100 MG ER capsule Take 1 capsule (100 mg total) by mouth at bedtime. 11/05/22  Yes Gayland Lauraine PARAS, NP  tamsulosin  (FLOMAX ) 0.4 MG CAPS capsule Take 0.4 mg by mouth daily. 09/18/21  Yes [provider]  traMADol  (ULTRAM ) 50 MG tablet Take 50 mg by mouth 4 (four) times daily as needed for moderate pain (pain score 4-6). 05/08/23  Yes [provider]  cyclobenzaprine (FLEXERIL) 5 MG tablet Take 5 mg by mouth 2 (two) times daily as needed for muscle spasms. Patient not taking: Reported on 09/18/2023    [provider]  enoxaparin  (LOVENOX ) 40 MG/0.4ML injection Inject 0.4 mLs (40 mg total) into the skin daily. 03/06/19 04/09/19  Chundi, Vahini, MD  phenytoin  (PHENYTOIN  INFATABS) 50 MG tablet CHEW AND SWALLOW 1 TABLET BY MOUTH AT BEDTIME Patient not taking: Reported on 09/18/2023 09/17/22   Gayland Lauraine PARAS, NP    Allergies: Patient has no known allergies.    Review of Systems  Updated Vital Signs BP (!) 145/95   Pulse (!) 104   Temp 99.4 F (37.4 C) (Temporal)   Resp 16   SpO2 97%  Physical Exam Nursing note reviewed.  Constitutional:      General: He is not in acute distress.    Appearance: He is well-developed. He is ill-appearing.     Comments: Opens eyes to voice full stimuli occasionally but does not track.  Withdraws all 4 extremities.  Occasionally will moan.  Appears postictal.  HENT:     Head: Normocephalic and atraumatic.     Right Ear: External ear normal.     Left Ear: External ear normal.     Nose: Nose normal.  Eyes:     Extraocular Movements: Extraocular movements intact.     Conjunctiva/sclera: Conjunctivae normal.     Pupils: Pupils are equal, round, and reactive to light.     Comments: Pupils  4 mm bilateral  Cardiovascular:     Rate and Rhythm: Normal rate and regular rhythm.     Heart sounds: Normal heart sounds.  Pulmonary:     Effort: Pulmonary effort is normal. No respiratory distress.     Breath sounds: Normal breath sounds.     Comments: Satting well on room air.  No gurgling or choking or coughing. Musculoskeletal:     Cervical back: Normal range of motion and neck supple.  Skin:    General: Skin is warm and dry.  Neurological:     Mental Status: He is disoriented.     Comments: Withdraws all 4 extremities.  Psychiatric:        Mood and Affect: Mood normal.        Behavior: Behavior normal.     (all labs ordered are listed, but only abnormal results are displayed) Labs Reviewed  COMPREHENSIVE METABOLIC PANEL WITH GFR - Abnormal; Notable for the following components:      Result Value   Chloride 94 (*)    Glucose, Bld 159 (*)    AST 60 (*)    ALT 47 (*)    Anion gap 17 (*)    All other components within normal limits  CBC WITH DIFFERENTIAL/PLATELET - Abnormal; Notable for the following components:   WBC 13.9 (*)    Neutro Abs 11.9 (*)    Monocytes Absolute 1.1 (*)    Abs Immature Granulocytes 0.12 (*)    All other components within normal limits  PHENYTOIN  LEVEL, TOTAL - Abnormal; Notable for the following components:   Phenytoin  Lvl 7.1 (*)    All other components within normal limits  CK - Abnormal; Notable for the following components:   Total CK 2,375 (*)    All other components within normal limits  CBG MONITORING, ED - Abnormal; Notable for the following components:   Glucose-Capillary 167 (*)    All other components within normal limits  ETHANOL  LEVETIRACETAM  LEVEL  URINALYSIS, ROUTINE W REFLEX MICROSCOPIC  RAPID URINE DRUG SCREEN, HOSP PERFORMED    EKG: EKG Interpretation Date/Time:  Thursday September 18 2023 14:01:55 EDT Ventricular Rate:  91 PR Interval:    QRS Duration:  94 QT Interval:  350 QTC Calculation: 431 R  Axis:   -39  Text Interpretation: Atrial fibrillation Left axis deviation Abnormal T, consider ischemia, inferior leads Minimal ST elevation, lateral leads Artifact in lead(s) I II III aVR aVL aVF V1 V2 V4 V5 V6 Confirmed by Yolande Charleston [phone]) on 09/18/2023 2:53:20 PM  Radiology: CT Cervical Spine Wo Contrast Result Date: 09/18/2023 CLINICAL DATA:  Neck trauma EXAM: CT CERVICAL SPINE WITHOUT CONTRAST TECHNIQUE: Multidetector CT imaging of the cervical spine was performed without intravenous contrast. Multiplanar CT  image reconstructions were also generated. RADIATION DOSE REDUCTION: This exam was performed according to the departmental dose-optimization program which includes automated exposure control, adjustment of the mA and/or kV according to patient size and/or use of iterative reconstruction technique. COMPARISON:  09/23/2022 FINDINGS: Alignment: Slight anterolisthesis of C3 on C4 and C4 on C5, stable since prior study. Skull base and vertebrae: No acute fracture. No primary bone lesion or focal pathologic process. Soft tissues and spinal canal: No prevertebral fluid or swelling. No visible canal hematoma. Disc levels: Posterior fusion and laminectomy changes from C3-C5. Mild diffuse degenerative disc disease. Upper chest: No acute findings Other: None IMPRESSION: Degenerative and postoperative changes.  No acute bony abnormality. Electronically Signed   By: Franky Crease M.D.   On: 09/18/2023 14:42   CT Head Wo Contrast Result Date: 09/18/2023 CLINICAL DATA:  Seizure, fall EXAM: CT HEAD WITHOUT CONTRAST TECHNIQUE: Contiguous axial images were obtained from the base of the skull through the vertex without intravenous contrast. RADIATION DOSE REDUCTION: This exam was performed according to the departmental dose-optimization program which includes automated exposure control, adjustment of the mA and/or kV according to patient size and/or use of iterative reconstruction technique. COMPARISON:   09/23/2022 FINDINGS: Brain: There is atrophy and chronic small vessel disease changes. No acute intracranial abnormality. Specifically, no hemorrhage, hydrocephalus, mass lesion, acute infarction, or significant intracranial injury. Vascular: No hyperdense vessel or unexpected calcification. Skull: No acute calvarial abnormality. Sinuses/Orbits: No acute findings Other: None IMPRESSION: Atrophy, chronic microvascular disease. No acute intracranial abnormality. Electronically Signed   By: Franky Crease M.D.   On: 09/18/2023 14:40   DG Foot Complete Left Result Date: 09/18/2023 EXAM: 3 or more VIEW(S) XRAY OF THE LEFT FOOT 09/18/2023 02:18:00 PM COMPARISON: None available. CLINICAL HISTORY: Foot injury. Per-triage notes: Pt BIB [facility] from home d/t unwitnessed seizures while at home with family. Family told EMS he did not want to come after the first one today \\T \ was unable to report when each happened but the son reports when he found his unresponsive \\T \ covered in coffee ground colored emesis approx. 1.5 hr before arrival to ED he decided to call 911. Pt does take Keppra , unknown if any doses have been missed. EMS gave pt 2.5 midazolam  via 22g Rt hand. \T\ at time of arrival reports he was blinking his eyes \\T \ squeezing hands on demand. Did not have any full body seizures with them. 161/77, 96 bpm, 99% on 3L O2, CO2 29, 21 resp, CBG 118. FINDINGS: BONES AND JOINTS: Second through fifth hammertoe deformities. Mild hallux valgus deformity with degenerative changes at the first metatarsophalangeal joint. Moderate degenerative changes involving the fifth metatarsal phalangeal joint with associated chronic posttraumatic deformity involving the head of the fifth metatarsal bone. No acute fracture. SOFT TISSUES: Vascular calcifications noted. IMPRESSION: 1. No acute findings. 2. Moderate degenerative changes involving the fifth metatarsal phalangeal joint with associated chronic posttraumatic deformity involving  the head of the fifth metatarsal bone. 3. Second through fifth hammertoe deformities. Electronically signed by: Waddell Calk MD 09/18/2023 02:26 PM EDT RP Workstation: [id number]     Procedures   Medications Ordered in the ED  levETIRAcetam  (KEPPRA ) undiluted injection 1,250 mg (1,250 mg Intravenous Given 09/18/23 1357)  LORazepam  (ATIVAN ) 2 MG/ML injection (2 mg  Given 09/18/23 1401)  levETIRAcetam  (KEPPRA ) undiluted injection 2,750 mg (2,750 mg Intravenous Given 09/18/23 1532)  sodium chloride  0.9 % bolus 1,000 mL (0 mLs Intravenous Stopped 09/18/23 1628)  levETIRAcetam  (KEPPRA ) undiluted injection 1,000 mg (1,000 mg Intravenous  Given 09/18/23 1624)    Clinical Course as of 09/18/23 1704  Thu Sep 18, 2023  1433 Dr Matthews from neurology consulted.  Recommends Keppra  load.  They will come evaluate the patient for EEG [RP]  1600 Signed out to Dr. Pamella [RP]    Clinical Course User Index [RP] Yolande Lamar BROCKS, MD                                 Medical Decision Making Amount and/or Complexity of Data Reviewed Labs: ordered. Radiology: ordered.  Risk Decision regarding hospitalization.   Mario Waters is a 83 year old male with a history of seizures who presents to the emergency department with seizure  Initial Ddx:  Status epilepticus, seizure, TBI, C-spine injury, electrolyte abnormality, infection  MDM/Course:  Patient presents to the emergency department with multiple seizures.  Not having any infectious symptoms prior to this.  Is not currently back to his baseline but does not appear to be convulsing initially.  Currently is protecting his airway.  Is able to withdraw all 4 extremities and did open his eyes to voice at 1 point in time.  He was loaded with IV Keppra  20 mg/kg.  He was monitored and did have some bicep fasciculations and continued to not respond.  Was given 2 doses of Ativan  and the remainder of 4 g of Keppra  for a full load.  CT head and C-spine without acute  abnormality.  Seizure activity appeared to have stopped upon re-evaluation.  Neurology is coming to evaluate the patient and initiate EEG.  Signed out to the oncoming physician awaiting repeat evaluation and final disposition.  Of note, was found to have mild rhabdomyolysis and was given some IV fluids.  This patient presents to the ED for concern of complaints listed in HPI, this involves an extensive number of treatment options, and is a complaint that carries with it a high risk of complications and morbidity. Disposition including potential need for admission considered.   Dispo: Pending remainder of workup  Additional history obtained from son Records reviewed Outpatient Clinic Notes The following labs were independently interpreted: Chemistry and show no acute abnormality I independently reviewed the following imaging with scope of interpretation limited to determining acute life threatening conditions related to emergency care: CT Head and agree with the radiologist interpretation with the following exceptions: none I personally reviewed and interpreted cardiac monitoring: normal sinus rhythm  I personally reviewed and interpreted the pt's EKG: see above for interpretation  I have reviewed the patients home medications and made adjustments as needed Consults: Neurology Social Determinants of health:  Geriatric  CRITICAL CARE Performed by: Lamar BROCKS Yolande   Total critical care time: 30 minutes  Critical care time was exclusive of separately billable procedures and treating other patients.  Critical care was necessary to treat or prevent imminent or life-threatening deterioration.  Critical care was time spent personally by me on the following activities: development of treatment plan with patient and/or surrogate as well as nursing, discussions with consultants, evaluation of patient's response to treatment, examination of patient, obtaining history from patient or surrogate,  ordering and performing treatments and interventions, ordering and review of laboratory studies, ordering and review of radiographic studies, pulse oximetry and re-evaluation of patient's condition.   Portions of this note were generated with Scientist, clinical (histocompatibility and immunogenetics). Dictation errors may occur despite best attempts at proofreading.     Final diagnoses:  Status epilepticus (HCC)  Non-traumatic rhabdomyolysis    ED Discharge Orders     None          Yolande Lamar BROCKS, MD 09/18/23 1702    Yolande Lamar BROCKS, MD 09/18/23 [id number]

## 2023-09-18 NOTE — ED Notes (Signed)
 X-ray at bedside.

## 2023-09-19 ENCOUNTER — Inpatient Hospital Stay (HOSPITAL_COMMUNITY)

## 2023-09-19 DIAGNOSIS — G40909 Epilepsy, unspecified, not intractable, without status epilepticus: Secondary | ICD-10-CM

## 2023-09-19 DIAGNOSIS — M6282 Rhabdomyolysis: Secondary | ICD-10-CM | POA: Diagnosis not present

## 2023-09-19 DIAGNOSIS — R569 Unspecified convulsions: Secondary | ICD-10-CM | POA: Diagnosis not present

## 2023-09-19 LAB — COMPREHENSIVE METABOLIC PANEL WITH GFR
ALT: 48 U/L — ABNORMAL HIGH (ref 0–44)
AST: 90 U/L — ABNORMAL HIGH (ref 15–41)
Albumin: 3.6 g/dL (ref 3.5–5.0)
Alkaline Phosphatase: 61 U/L (ref 38–126)
Anion gap: 14 (ref 5–15)
BUN: 11 mg/dL (ref 8–23)
CO2: 25 mmol/L (ref 22–32)
Calcium: 8.7 mg/dL — ABNORMAL LOW (ref 8.9–10.3)
Chloride: 96 mmol/L — ABNORMAL LOW (ref 98–111)
Creatinine, Ser: 0.81 mg/dL (ref 0.61–1.24)
GFR, Estimated: >60 mL/min (ref >60)
Glucose, Bld: 109 mg/dL — ABNORMAL HIGH (ref 70–99)
Potassium: 3.3 mmol/L — ABNORMAL LOW (ref 3.5–5.1)
Sodium: 135 mmol/L (ref 135–145)
Total Bilirubin: 1.6 mg/dL — ABNORMAL HIGH (ref 0.0–1.2)
Total Protein: 6.6 g/dL (ref 6.5–8.1)

## 2023-09-19 LAB — CBC
HCT: 37.5 % — ABNORMAL LOW (ref 39.0–52.0)
Hemoglobin: 13.2 g/dL (ref 13.0–17.0)
MCH: 32.9 pg (ref 26.0–34.0)
MCHC: 35.2 g/dL (ref 30.0–36.0)
MCV: 93.5 fL (ref 80.0–100.0)
Platelets: 233 K/uL (ref 150–400)
RBC: 4.01 MIL/uL — ABNORMAL LOW (ref 4.22–5.81)
RDW: 13.1 % (ref 11.5–15.5)
WBC: 14.1 K/uL — ABNORMAL HIGH (ref 4.0–10.5)
nRBC: 0 % (ref 0.0–0.2)

## 2023-09-19 LAB — URINALYSIS, ROUTINE W REFLEX MICROSCOPIC
Bilirubin Urine: NEGATIVE
Glucose, UA: NEGATIVE mg/dL
Ketones, ur: NEGATIVE mg/dL
Leukocytes,Ua: NEGATIVE
Nitrite: NEGATIVE
Protein, ur: 100 mg/dL — AB
Specific Gravity, Urine: 1.023 (ref 1.005–1.030)
pH: 6 (ref 5.0–8.0)

## 2023-09-19 LAB — RAPID URINE DRUG SCREEN, HOSP PERFORMED
Amphetamines: NOT DETECTED
Barbiturates: NOT DETECTED
Benzodiazepines: POSITIVE — AB
Cocaine: NOT DETECTED
Opiates: NOT DETECTED
Tetrahydrocannabinol: NOT DETECTED

## 2023-09-19 LAB — LEVETIRACETAM LEVEL: Levetiracetam Lvl: <2 ug/mL — ABNORMAL LOW (ref 10.0–40.0)

## 2023-09-19 LAB — CK: Total CK: 4243 U/L — ABNORMAL HIGH (ref 49–397)

## 2023-09-19 MED ORDER — LEVETIRACETAM 750 MG PO TABS
750.0000 mg | ORAL_TABLET | Freq: Two times a day (BID) | ORAL | Status: DC
  Administered 2023-09-19 – 2023-09-22 (×6): 750 mg via ORAL
  Filled 2023-09-19 (×7): qty 1

## 2023-09-19 MED ORDER — PHENYTOIN SODIUM EXTENDED 100 MG PO CAPS: 100.0000 mg | ORAL_CAPSULE | Freq: Every day | ORAL | Status: DC

## 2023-09-19 MED ORDER — PHENYTOIN SODIUM EXTENDED 100 MG PO CAPS
100.0000 mg | ORAL_CAPSULE | Freq: Every day | ORAL | Status: DC
  Administered 2023-09-19 – 2023-09-21 (×3): 100 mg via ORAL
  Filled 2023-09-19 (×4): qty 1

## 2023-09-19 MED ORDER — LACTATED RINGERS IV SOLN: INTRAVENOUS | Status: AC

## 2023-09-19 MED ORDER — PHENYTOIN SODIUM EXTENDED 30 MG PO CAPS: 150.0000 mg | ORAL_CAPSULE | Freq: Every day | ORAL | Status: DC

## 2023-09-19 MED ORDER — PHENYTOIN SODIUM EXTENDED 30 MG PO CAPS: 30.0000 mg | ORAL_CAPSULE | Freq: Every day | ORAL | Status: DC

## 2023-09-19 MED ORDER — PHENYTOIN SODIUM EXTENDED 30 MG PO CAPS
30.0000 mg | ORAL_CAPSULE | Freq: Every day | ORAL | Status: DC
  Administered 2023-09-20 – 2023-09-22 (×3): 30 mg via ORAL
  Filled 2023-09-19 (×3): qty 1

## 2023-09-19 MED ORDER — PHENYTOIN SODIUM 50 MG/ML IJ SOLN
100.0000 mg | Freq: Once | INTRAMUSCULAR | Status: AC
  Administered 2023-09-19: 100 mg via INTRAVENOUS
  Filled 2023-09-19: qty 2

## 2023-09-19 MED ORDER — LEVETIRACETAM 750 MG PO TABS
750.0000 mg | ORAL_TABLET | Freq: Two times a day (BID) | ORAL | Status: DC
Start: 2023-09-19 — End: 2023-09-19

## 2023-09-19 MED ORDER — LEVETIRACETAM (KEPPRA) 500 MG/5 ML ADULT IV PUSH
750.0000 mg | Freq: Two times a day (BID) | INTRAVENOUS | Status: DC
  Filled 2023-09-19 (×3): qty 10

## 2023-09-19 MED ORDER — PHENYTOIN SODIUM EXTENDED 100 MG PO CAPS
100.0000 mg | ORAL_CAPSULE | Freq: Once | ORAL | Status: DC
  Filled 2023-09-19: qty 1

## 2023-09-19 NOTE — Progress Notes (Signed)
 MB performed maintenance on Fp1, Fp2, Ground, A1, A2, P3,P4 electrodes. All impedances are below 10k ohms. No skin breakdown noted.

## 2023-09-19 NOTE — Progress Notes (Signed)
 Subjective: No acute events overnight.  No further seizures.  ROS: negative except above  Examination  Vital signs in last 24 hours: Temp:  [97.4 F (36.3 C)-100.2 F (37.9 C)] 98.4 F (36.9 C) (08/29 0758) Pulse Rate:  [56-111] 95 (08/29 0758) Resp:  [16-29] 16 (08/29 0758) BP: (121-197)/(58-99) 134/64 (08/29 0758) SpO2:  [90 %-100 %] 94 % (08/29 0758) Weight:  [57.8 kg] 57.8 kg (08/28 2037)  General: lying in bed, NAD Neuro: MS: Alert, oriented to place and person, not to time, follows commands, able to name objects CN: pupils equal and reactive,  EOMI, face symmetric, tongue midline, normal sensation over face, Motor: 5/5 strength in all 4 extremities Coordination: normal Gait: not tested  Basic Metabolic Panel: Recent Labs  Lab 09/18/23 1347 09/19/23 0249  NA 137 135  K 4.1 3.3*  CL 94* 96*  CO2 26 25  GLUCOSE 159* 109*  BUN 10 11  CREATININE 0.Mario 0.81  CALCIUM  9.6 8.7*    CBC: Recent Labs  Lab 09/18/23 1347 09/19/23 0751  WBC 13.9* 14.1*  NEUTROABS 11.9*  --   HGB 15.2 13.2  HCT 44.0 37.5*  MCV 93.8 93.5  PLT 281 233     Coagulation Studies: No results for input(s): LABPROT, INR in the last 72 hours.  Imaging personally reviewed  MRI brain without contrast 09/18/2023: No acute abnormality.   ASSESSMENT AND PLAN:Mario Waters  hx of seizures on Dilantin  and Keppra , memory deficit and neuropathy who presents to [facility] Ed via EMS for having multiple seizures today. Per RN at the bedside, family reported he had 3 seizures and patient refused to come to the ED, however son found him unresponsive and covered in emesis which prompted him to call 911. Phenytoin  level 7.1   Epilepsy with breakthrough seizures Rhabdomyolysis -no clear etiology - Increase Keppra  to 750 mg twice daily (home dose of 500 mg twice daily - On phenytoin  extended release 100 mg at bedtime at home.  Will give one-time IV dose of phenytoin  100 mg this morning and if patient passes  swallow eval, will resume phenytoin  extended release 100 mg at bedtime as well as 30 mg in morning per pharmacy recommendations -DC LTM EEG tomorrow if no seizures - Continue seizure precautions - Discussed plan with hospitalist via secure chat    I personally spent a total of 38 minutes in the care of the patient today including getting/reviewing separately obtained history, performing a medically appropriate exam/evaluation, counseling and educating, placing orders, referring and communicating with other health care professionals, documenting clinical information in the EHR, independently interpreting results, and coordinating care.           Arlin Krebs Epilepsy [facility] For questions after 5pm please refer to AMION to reach the Neurologist on call

## 2023-09-19 NOTE — Progress Notes (Addendum)
 Phenytoin  Initial Consult Indication: Seizure disorder  No Known Allergies  Patient Measurements: Height: 5' 11 (180.3 cm) Weight: 57.8 kg (127 lb 6.8 oz) IBW/kg (Calculated) : 75.3 TPN AdjBW (KG): 57.8 Body mass index is 17.77 kg/m.   Vital signs: Temp: 97.7 F (36.5 C) (08/29 0323) Temp Source: Oral (08/29 0323) BP: 121/66 (08/29 0323) Pulse Rate: 96 (08/29 0323)  Labs: Lab Results  Component Value Date/Time   Albumin 3.6 09/19/2023 0249   Albumin 4.5 09/18/2023 1347   Phenytoin  Lvl 7.1 (L) 09/18/2023 1347   Lab Results  Component Value Date   PHENYTOIN  7.1 (L) 09/18/2023   Estimated Creatinine Clearance: 54.5 mL/min (by C-G formula based on SCr of 0.81 mg/dL).     Assessment: Patient has been on phenytoin  100mg  at bedtime for years with levels drawn around 9am resulting at 12.9-17.7 in therapeutic range. Levels ~12 hours after dose are peak levels. Ideally, trough levels should be drawn with goal of 10-20. Level on admission at 13:47 was 7.1 and subtherapeutic. Unclear compliance as patient is nonverbal at this time. Patient received keppra  IV load in ED. On med hx, family reported patient might have taken the last dose last week. Suspect patient may be subtherapeutic in the latter half of each day. Given seizures on admission, will give small load of 100 mg x1 then increase to 30 mg in the morning and 100 mg at bedtime.   Corrected phenytoin  level (if needed): n/a Seizure activity: 8/28 breakthrough seizures Significant potential drug interactions: none  Goals of care:  Total phenytoin  level: 10-20 mcg/ml Free phenytoin  level: 1-2 mcg/ml  Plan:  Phenytoin  ER 130 mg x1 then increase to 30 mg in the morning and 100mg  at bedtime F/u trough level 9/1  Anticipated maintenance dose: 30mg  in the morning and 100mg  at bedtime or if patient will have issues with adherence then consider 150mg  ER at bedtime  Pharmacy will continue to follow regarding obtaining total  phenytoin  levels and dose adjustments as indicated.  Jinnie Door, PharmD, BCPS, BCCP Clinical Pharmacist  Please check AMION for all [facility] phone numbers After 10:00 PM, call Main Pharmacy [phone]

## 2023-09-19 NOTE — Procedures (Addendum)
 Patient Name: Mario Waters  MRN: [id number]  Epilepsy Attending: Arlin MALVA Krebs  Referring Physician/Provider: Waddell Karna LABOR, NP  Duration: 09/18/2023 2350 to 09/19/2023 2350  Patient history:  83 y.o. male with hx of seizures on Dilantin  and Keppra , memory deficit and neuropathy who presents to [facility] Ed via EMS for having multiple seizures today.  EEG to evaluate for seizure  Level of alertness: awake/ lethargic , asleep  AEDs during EEG study: LEV, PHT  Technical aspects: This EEG study was done with scalp electrodes positioned according to the 10-20 International system of electrode placement. Electrical activity was reviewed with band pass filter of 1-70Hz , sensitivity of 7 uV/mm, display speed of 23mm/sec with a 60Hz  notched filter applied as appropriate. EEG data were recorded continuously and digitally stored.  Video monitoring was available and reviewed as appropriate.  Description: EEG showed continuous generalized 3 to 6 Hz theta-delta slowing admixed with 15-18hz  beta activity. Sleep was characterized by sleep spindles (12-14hz ), maximal fronto-central region. Hyperventilation and photic stimulation were not performed.     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 No seizures or epileptiform discharges were seen throughout the recording.  Itzamara Casas O Dejour Vos

## 2023-09-19 NOTE — Progress Notes (Signed)
 [facility]   ROUNDING   NOTE Mario Waters FMW:[id number]  DOB: [birth year]  DOA: 09/18/2023  PCP: Mario Kingfisher, MD  09/19/2023,10:01 AM  LOS: 1 day    Code Status: Full     from: Home current Dispo: Unclear    83 year old black male prior left hip fracture 02/2019 Underlying seizure disorder last known 01/2016 follows with [facility]y on Keppra  and Dilantin  and sees Dr. 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  Was nonresponsive-did injure his toe-received Versed  in the ED Proceeded to have a subsequent seizure in the ED Neurology consulted-loaded with 4 g of Keppra  Keppra  level sent phenytoin  level 7.1  CT cervical spine CT head no acute anomalies MRI brain no acute anomaly EEG severe diffuse encephalopathy no seizure or epileptiform discharges Foot x-ray no injury  Plan  Provoked seizure unclear if compliant, phenytoin  level 7.1 Keppra  level is still pending due to how much he missed wife reports missed several days of not taking meds--due to stress of trying to move and look [name] discontinue tramadol -[facility] needs to find a different pain modality for him Currently Keppra  750 twice daily Dilantin  30 a.m. 100 p.m. Probably can start diet if cleared by speech therapy to do so however would be cautious as he is a little sleepy  Elevated CK secondary to seizures,  Trending in the wrong direction-increase saline to 125 cc/H recheck in a.m.-if any worse we will need to workup  Transaminase elevation Possibly secondary to mild rhabdo as above? Monitor trends daily  Reactive leukocytosis versus other etiologies Will get an x-ray this morning to rule out pneumonia given persistence of leukocytosis 14 He might have  aspirated  BPH continue Flomax  and alfuzosin   Peripheral neuropathy chronic pain with history of cervical surgery follows with [facility] See above discussion Would consider increase of dose of gabapentin for antiepileptic modality instead of use of tramadol  in the future Defer to neurology  Hyperlipidemia holding statin given above  Discussed with wife Mario Waters [phone] on 09/19/23  Data Reviewed:   Sodium 135 potassium 3.3 BUN/creatinine 11/0.8 AST/ALT 90/48 WBC 14.1 hemoglobin 13.2 CK 2375---4243  DVT prophylaxis: Lovenox   Status is: Inpatient Remains inpatient appropriate because:   Requires management of seizures     Subjective: Can name place but not time date person and seems generally disoriented Picking at the liquids of the EEG on his head    Objective + exam Vitals:   09/18/23 2037 09/18/23 2354 09/19/23 0323 09/19/23 0758  BP: 127/70 (!) 140/70 121/66 134/64  Pulse: 89 (!) 56 96 95  Resp: 20 16 17 16   Temp: 98.5 F (36.9 C) 98.6 F (37 C) 97.7 F (36.5 C) 98.4 F (36.9 C)  TempSrc: Axillary Oral Oral Oral  SpO2: 90% 100% 97% 94%  Weight: 57.8 kg     Height: 5' 11 (1.803 m)      Filed Weights   09/18/23 2037  Weight: 57.8 kg     Examination:   Follows commands raises hands above head Arcus Synolis present No icterus no pallor S1-S2 no murmur sinus rhythm on monitor abdomen scaphoid seems quite malnourished with bitemporal wasting Straight leg raise bilaterally intact Integumentary did not examine  Scheduled Meds:  alfuzosin   10 mg Oral Q breakfast   enoxaparin  (LOVENOX )  injection  40 mg Subcutaneous Q24H   levETIRAcetam   750 mg Oral BID   Or   levETIRAcetam   750 mg Intravenous BID   pantoprazole   20 mg Oral Daily   phenytoin  (DILANTIN ) IV  100 mg Intravenous Once   tamsulosin   0.4 mg Oral Daily   Continuous Infusions:  Time 50  Jai-Gurmukh Kohana Amble, MD  [facility]

## 2023-09-19 NOTE — Evaluation (Signed)
 Clinical/Bedside Swallow Evaluation Patient Details  Name: Mario Waters MRN: [id number] Date of Birth: May 06, 1940  Today's Date: 09/19/2023 Time: SLP Start Time (ACUTE ONLY): 1215 SLP Stop Time (ACUTE ONLY): 1225 SLP Time Calculation (min) (ACUTE ONLY): 10 min  Past Medical History:  Past Medical History:  Diagnosis Date   Hip fracture (HCC) 02/2019   Neuropathy    Seizure disorder (HCC)    since 1990s. taking Dilantin  200mg  nightly   Seizures (HCC)    Past Surgical History:  Past Surgical History:  Procedure Laterality Date   CATARACT EXTRACTION, BILATERAL     HIP PINNING,CANNULATED Left 03/04/2019   Procedure: HIP PINNING;  Surgeon: Josefina Chew, MD;  Location: [facility] OR;  Service: Orthopedics;  Laterality: Left;   KNEE SURGERY     PENECTOMY     REPAIR OF PERFORATED ULCER     SPINE SURGERY     Cervical laminectomy   TOE SURGERY     HPI:  Mario Waters is a 83 y.o. male who presented to the ED for evaluation of seizures.  Patient reportedly found on floor at home by family after suspected unwitnessed seizure. MRI and CXR without acute findings.  Pt with medical history significant for seizure disorder, HLD, BPH, chronic pain syndrome.    Assessment / Plan / Recommendation  Clinical Impression  Pt presents with functional swallowing as assessed clinically. Some thick secretions noted in oral cavity, mostly along soft palate.  SLP provided oral care prior to administration of PO trials.  Pt tolerated all consistencies trialed, including serial straw sips of thin liquid, without any clinical s/s of aspiration.  Pt largely edentulous, but has lost his dentures.  He reports no difficulty with mastication and exhibited good oral clearance of regular solids with lingual sweep, additional time.  Pt may need some feeding assistance.  He had difficulty following some directions for OME, but he appears safe to resume oral diet.  Recommend regular texture diet with thin liquids. SLP Visit  Diagnosis: Dysphagia, unspecified (R13.10)    Aspiration Risk  No limitations    Diet Recommendation Regular;Thin liquid    Liquid Administration via: Straw;Cup Medication Administration:  (As tolerated) Supervision: Patient able to self feed;Staff to assist with self feeding Compensations: Slow rate;Small sips/bites Postural Changes: Seated upright at 90 degrees    Other  Recommendations Oral Care Recommendations: Oral care BID     Assistance Recommended at Discharge    Functional Status Assessment Patient has not had a recent decline in their functional status  Frequency and Duration  (N/A)          Prognosis Prognosis for improved oropharyngeal function:  (N/A)      Swallow Study   General Date of Onset: 09/18/23 HPI: Mario Waters is a 83 y.o. male who presented to the ED for evaluation of seizures.  Patient reportedly found on floor at home by family after suspected unwitnessed seizure. MRI and CXR without acute findings.  Pt with medical history significant for seizure disorder, HLD, BPH, chronic pain syndrome. Type of Study: Bedside Swallow Evaluation Previous Swallow Assessment: None Diet Prior to this Study: NPO Temperature Spikes Noted: No History of Recent Intubation: No Behavior/Cognition: Alert;Cooperative;Pleasant mood Oral Cavity Assessment: Dried secretions;Excessive secretions Oral Care Completed by SLP: Yes Oral Cavity - Dentition: Edentulous Self-Feeding Abilities: Able to feed self Patient Positioning: Upright in bed Baseline Vocal Quality: Normal Volitional Cough: Weak Volitional Swallow: Able to elicit    Oral/Motor/Sensory Function Overall Oral Motor/Sensory Function: Mild impairment Facial Symmetry: Within  Functional Limits Lingual ROM: Within Functional Limits Lingual Symmetry: Within Functional Limits Lingual Strength: Reduced Velum: Within Functional Limits Mandible: Within Functional Limits   Ice Chips Ice chips: Not tested   Thin  Liquid Thin Liquid: Within functional limits Presentation: Straw    Nectar Thick Nectar Thick Liquid: Not tested   Honey Thick Honey Thick Liquid: Not tested   Puree Puree: Within functional limits Presentation: Spoon   Solid     Solid: Within functional limits Presentation: Self Fed      Anette FORBES Grippe , MA, CCC-SLP Acute Rehabilitation Services Office: [phone] 09/19/2023,12:44 PM

## 2023-09-19 NOTE — Plan of Care (Signed)

## 2023-09-19 NOTE — Plan of Care (Signed)
 Talking and answering questions. Passed swallow test and eating food. Large incontinent BM episode today. Son called and wanted the doctor to know of his large brown emesis episode prior to admission when found down.     Problem: Education: Goal: Knowledge of General Education information will improve Description: Including pain rating scale, medication(s)/side effects and non-pharmacologic comfort measures Outcome: Progressing   Problem: Activity: Goal: Risk for activity intolerance will decrease Outcome: Progressing   Problem: Nutrition: Goal: Adequate nutrition will be maintained Outcome: Progressing   Problem: Safety: Goal: Ability to remain free from injury will improve Outcome: Progressing   Problem: Skin Integrity: Goal: Risk for impaired skin integrity will decrease Outcome: Progressing

## 2023-09-20 ENCOUNTER — Inpatient Hospital Stay (HOSPITAL_COMMUNITY)

## 2023-09-20 DIAGNOSIS — R569 Unspecified convulsions: Secondary | ICD-10-CM | POA: Diagnosis not present

## 2023-09-20 DIAGNOSIS — G40909 Epilepsy, unspecified, not intractable, without status epilepticus: Secondary | ICD-10-CM | POA: Diagnosis not present

## 2023-09-20 LAB — CBC WITH DIFFERENTIAL/PLATELET
Abs Immature Granulocytes: 0.04 K/uL (ref 0.00–0.07)
Basophils Absolute: 0.1 K/uL (ref 0.0–0.1)
Basophils Relative: 1 %
Eosinophils Absolute: 0.1 K/uL (ref 0.0–0.5)
Eosinophils Relative: 1 %
HCT: 40.5 % (ref 39.0–52.0)
Hemoglobin: 14.1 g/dL (ref 13.0–17.0)
Immature Granulocytes: 0 %
Lymphocytes Relative: 22 %
Lymphs Abs: 2.3 K/uL (ref 0.7–4.0)
MCH: 32.9 pg (ref 26.0–34.0)
MCHC: 34.8 g/dL (ref 30.0–36.0)
MCV: 94.4 fL (ref 80.0–100.0)
Monocytes Absolute: 1 K/uL (ref 0.1–1.0)
Monocytes Relative: 10 %
Neutro Abs: 6.8 K/uL (ref 1.7–7.7)
Neutrophils Relative %: 66 %
Platelets: 226 K/uL (ref 150–400)
RBC: 4.29 MIL/uL (ref 4.22–5.81)
RDW: 13.1 % (ref 11.5–15.5)
WBC: 10.3 K/uL (ref 4.0–10.5)
nRBC: 0 % (ref 0.0–0.2)

## 2023-09-20 LAB — CK: Total CK: 1534 U/L — ABNORMAL HIGH (ref 49–397)

## 2023-09-20 LAB — COMPREHENSIVE METABOLIC PANEL WITH GFR
ALT: 43 U/L (ref 0–44)
AST: 68 U/L — ABNORMAL HIGH (ref 15–41)
Albumin: 3.5 g/dL (ref 3.5–5.0)
Alkaline Phosphatase: 55 U/L (ref 38–126)
Anion gap: 12 (ref 5–15)
BUN: 12 mg/dL (ref 8–23)
CO2: 28 mmol/L (ref 22–32)
Calcium: 8.8 mg/dL — ABNORMAL LOW (ref 8.9–10.3)
Chloride: 97 mmol/L — ABNORMAL LOW (ref 98–111)
Creatinine, Ser: 0.78 mg/dL (ref 0.61–1.24)
GFR, Estimated: >60 mL/min (ref >60)
Glucose, Bld: 116 mg/dL — ABNORMAL HIGH (ref 70–99)
Potassium: 3.4 mmol/L — ABNORMAL LOW (ref 3.5–5.1)
Sodium: 137 mmol/L (ref 135–145)
Total Bilirubin: 1 mg/dL (ref 0.0–1.2)
Total Protein: 6.6 g/dL (ref 6.5–8.1)

## 2023-09-20 NOTE — Progress Notes (Signed)
 [facility]   ROUNDING   NOTE Mario Waters FMW:[id number]  DOB: 1940/03/19  DOA: 09/18/2023  PCP: Mario Kingfisher, MD  09/20/2023,1:13 PM  LOS: 2 days    Code Status: Full     from: Home current Dispo: Unclear    83 year old black male prior left hip fracture 02/2019 Underlying seizure disorder last known 01/2016 follows with [facility] neurology on Keppra  and Dilantin  and sees Dr. Gregg Peripheral neuropathy Cervical spine surgery previously Sees pain management at [facility] medical for neuropathy arthritis and is on tramadol  for this  Brought in from home 8/28 due to unwitnessed seizures also found to be covered in coffee-ground emesis-unclear if they were grand mal-interatrial.  Was nonresponsive-did injure his toe-received Versed  in the ED Proceeded to have a subsequent seizure in the ED Neurology consulted-loaded with 4 g of Keppra  Keppra  level sent phenytoin  level 7.1, Keppra  level was less than 2  CT cervical spine CT head no acute anomalies MRI brain no acute anomaly EEG severe diffuse encephalopathy no seizure or epileptiform discharges Foot x-ray no injury  Plan  Provoked seizure secondary to noncompliance was subtherapeutic on both Keppra  and phenytoin   [name] discontinue tramadol -[facility] medical needs to find a different pain modality for him Currently continuing Keppra  p.o. 750 twice daily phenytoin  100 at bedtime, 30 mg a.m.  Elevated CK secondary to seizures,  Continue NS at 125 cc/H await CK repeat  Transaminase elevation Secondary to rhabdo improved  Reactive leukocytosis versus other etiologies X-ray 8/29 no suggestion pneumonia white count resolved on its own-this was reactive leukocytosis secondary to a seizure  BPH continue Flomax  and alfuzosin   Peripheral neuropathy chronic pain with history of cervical surgery follows with [facility] medical See above discussion ?  Increase dose of gabapentin for antiepileptic modality instead of use of tramadol  in the future Defer to  neurology  Hyperlipidemia holding statin given above  Discussed with wife Mario Waters [phone] on 09/19/23  Data Reviewed:   Sodium 137 potassium 3.4 chloride 97 BUN/creatinine 12/0.7 AST/ALT 68/43 WBC down from 14-10 platelet 233-226  DVT prophylaxis: Lovenox   Status is: Inpatient Remains inpatient appropriate because:   Requires management of seizures     Subjective:  More coherent but only oriented x 1 to the hospital Cannot tell me date time year seems to have slow mentation follows commands however  Objective + exam Vitals:   09/19/23 2327 09/20/23 0311 09/20/23 0740 09/20/23 1112  BP: 123/64 130/68 (!) 141/67 134/66  Pulse: 92 82 94 88  Resp: 18 18 20 20   Temp: 99.2 F (37.3 C) 98.3 F (36.8 C) 98.1 F (36.7 C) 97.7 F (36.5 C)  TempSrc: Oral Oral Oral Oral  SpO2: 97% 100% 99% 100%  Weight:      Height:       Filed Weights   09/18/23 2037  Weight: 57.8 kg     Examination:   Follows commands raises hands above head Arcus + S1-S2 no murmur ROM intact Power 5/5 Euthymic otherwise  Scheduled Meds:  alfuzosin   10 mg Oral Q breakfast   enoxaparin  (LOVENOX ) injection  40 mg Subcutaneous Q24H   levETIRAcetam   750 mg Oral BID   Or   levETIRAcetam   750 mg Intravenous BID   pantoprazole   20 mg Oral Daily   phenytoin   100 mg Oral QHS   phenytoin   30 mg Oral Daily   tamsulosin   0.4 mg Oral Daily   Continuous Infusions:  Time 30  Mario Armandina Iman, MD  [facility]

## 2023-09-20 NOTE — Plan of Care (Signed)
 No seizures overnight. D/c LTM EEG. No change to AED recommendations in neurology progress note from Dr. Shelton yesterday. Neurology will not continue to actively follow, but please re-engage if additional neurologic concerns arise.  Elida Ross, MD [facility] [phone]  If 7pm- 7am, please page neurology on call as listed in AMION.

## 2023-09-20 NOTE — Procedures (Signed)
 Patient Name: Mario Waters  MRN: [id number]  Epilepsy Attending: Arlin MALVA Krebs  Referring Physician/Provider: Waddell Karna LABOR, NP  Duration: 09/19/2023 2350 to 09/20/2023 1125   Patient history:  83 y.o. male with hx of seizures on Dilantin  and Keppra , memory deficit and neuropathy who presents to [facility] via EMS for having multiple seizures today.  EEG to evaluate for seizure   Level of alertness: awake/ lethargic , asleep   AEDs during EEG study: LEV, PHT   Technical aspects: This EEG study was done with scalp electrodes positioned according to the 10-20 International system of electrode placement. Electrical activity was reviewed with band pass filter of 1-70Hz , sensitivity of 7 uV/mm, display speed of 70mm/sec with a 60Hz  notched filter applied as appropriate. EEG data were recorded continuously and digitally stored.  Video monitoring was available and reviewed as appropriate.   Description: The posterior dominant rhythm consists of 7.5 Hz activity of moderate voltage (25-35 uV) seen predominantly in posterior head regions, symmetric and reactive to eye opening and eye closing. Sleep was characterized by vertex waves, sleep spindles (12 to 14 Hz), maximal frontocentral region. EEG also showed continuous generalized 3 to 6 Hz theta-delta slowing. Hyperventilation and photic stimulation were not performed.      ABNORMALITY - Continuous slow, generalized   IMPRESSION: This study is suggestive of moderate diffuse encephalopathy. No seizures or epileptiform discharges were seen throughout the recording.   Mario Waters

## 2023-09-20 NOTE — Plan of Care (Signed)

## 2023-09-20 NOTE — Evaluation (Signed)
 Physical Therapy Evaluation Patient Details Name: Mario Waters MRN: [id number] DOB: 04-11-1940 Today's Date: 09/20/2023  History of Present Illness  83 y.o. male admitted 8/28 for seizures. PMH, hip fx 2021, neuropathy, and seizures.  Clinical Impression  Pt admitted with above diagnosis. Limited historian, some information obtained from admission and prior PT assessment several years ago. Reports that he lives with wife and can typically get himself out of bed to walk at home (hx of fall and hip fx 2021.)  He required mod-max A +2 for bed mobility and max assist to stand several times from elevated bed surface today. Episode of bowel incontinence. Pericare performed with aide of Nurse Tech. Conservatively think patient will benefit from continued inpatient follow up therapy, <3 hours/day. Will update recs if he recovers quickly but suspect this may take a while to fully regain functional independence to PLOF.  Pt currently with functional limitations due to the deficits listed below (see PT Problem List). Pt will benefit from acute skilled PT to increase their independence and safety with mobility to allow discharge.           If plan is discharge home, recommend the following: Two people to help with walking and/or transfers;Two people to help with bathing/dressing/bathroom;Assistance with cooking/housework;Direct supervision/assist for medications management;Direct supervision/assist for financial management;Assist for transportation;Supervision due to cognitive status   Can travel by private vehicle   No    Equipment Recommendations None recommended by PT  Recommendations for Other Services       Functional Status Assessment Patient has had a recent decline in their functional status and demonstrates the ability to make significant improvements in function in a reasonable and predictable amount of time.     Precautions / Restrictions Precautions Precautions: Fall (seizure) Recall  of Precautions/Restrictions: Impaired Restrictions Weight Bearing Restrictions Per Provider Order: No      Mobility  Bed Mobility Overal bed mobility: Needs Assistance Bed Mobility: Rolling, Sidelying to Sit, Sit to Supine Rolling: Mod assist, Used rails Sidelying to sit: Mod assist, HOB elevated, Used rails   Sit to supine: Max assist, +2 for physical assistance   General bed mobility comments: Mod assist to roll and rise to EOB, delayed initiation. cues for technique and rail use. Max assist +2 to return to supine position for trunk and LE support.    Transfers Overall transfer level: Needs assistance Equipment used: Rolling walker (2 wheels) Transfers: Sit to/from Stand Sit to Stand: Max assist, From elevated surface           General transfer comment: max assist for boost and balance to stand from elevated bed surface. performed x3. Episode of bowel incontinence. Stood for [employer] with Airline pilot. Each stand <30 seconds with gradual sinking and lean towards Rt.    Ambulation/Gait                  Stairs            Wheelchair Mobility     Tilt Bed    Modified Rankin (Stroke Patients Only)       Balance Overall balance assessment: Needs assistance Sitting-balance support: Bilateral upper extremity supported, Feet supported Sitting balance-Leahy Scale: Poor Sitting balance - Comments: posteror lean. Postural control: Posterior lean Standing balance support: Bilateral upper extremity supported, Reliant on assistive device for balance Standing balance-Leahy Scale: Zero  Pertinent Vitals/Pain Pain Assessment Pain Assessment: No/denies pain    Home Living Family/patient expects to be discharged to:: Private residence Living Arrangements: Spouse/significant other Available Help at Discharge: Family Type of Home: House Home Access: Level entry       Home Layout: One level Home Equipment:  Agricultural consultant (2 wheels);Cane - single point Additional Comments: Majority of info pulled from prior visit years ago. May not be accurate. pt states he lives with wife and can usually get out of bed on his own. otherwise limited historian    Prior Function Prior Level of Function : Patient poor historian/Family not available             Mobility Comments: Limited hx. States he can walk and get himself out of bed at home. ADLs Comments: Limited hx reported. States he can get himself out of bed.     Extremity/Trunk Assessment   Upper Extremity Assessment Upper Extremity Assessment: Defer to OT evaluation    Lower Extremity Assessment Lower Extremity Assessment: Generalized weakness;Difficult to assess due to impaired cognition       Communication   Communication Communication: Impaired Factors Affecting Communication: Difficulty expressing self    Cognition Arousal: Alert Behavior During Therapy: Flat affect   PT - Cognitive impairments: No family/caregiver present to determine baseline, Orientation, Awareness, Memory, Attention, Initiation, Sequencing, Problem solving, Safety/Judgement                       PT - Cognition Comments: Very delayed processing Following commands: Impaired Following commands impaired: Follows one step commands with increased time     Cueing Cueing Techniques: Verbal cues, Gestural cues, Tactile cues     General Comments General comments (skin integrity, edema, etc.): Bowel incontinence, assisted with pericare by NT.    Exercises     Assessment/Plan    PT Assessment Patient needs continued PT services  PT Problem List Decreased strength;Decreased activity tolerance;Decreased balance;Decreased mobility;Decreased coordination;Decreased cognition;Decreased knowledge of use of DME;Decreased safety awareness;Decreased knowledge of precautions;Impaired tone       PT Treatment Interventions DME instruction;Gait training;Functional  mobility training;Therapeutic activities;Therapeutic exercise;Balance training;Neuromuscular re-education;Cognitive remediation;Patient/family education;Wheelchair mobility training    PT Goals (Current goals can be found in the Care Plan section)  Acute Rehab PT Goals Patient Stated Goal: None stated PT Goal Formulation: Patient unable to participate in goal setting Time For Goal Achievement: 10/04/23 Potential to Achieve Goals: Good    Frequency Min 2X/week     Co-evaluation               AM-PAC PT 6 Clicks Mobility  Outcome Measure Help needed turning from your back to your side while in a flat bed without using bedrails?: A Lot Help needed moving from lying on your back to sitting on the side of a flat bed without using bedrails?: A Lot Help needed moving to and from a bed to a chair (including a wheelchair)?: Total Help needed standing up from a chair using your arms (e.g., wheelchair or bedside chair)?: A Lot Help needed to walk in hospital room?: Total Help needed climbing 3-5 steps with a railing? : Total 6 Click Score: 9    End of Session Equipment Utilized During Treatment: Gait belt Activity Tolerance: Other (comment) (weakness and confusion limiting) Patient left: in bed;with call bell/phone within reach;with bed alarm set;with nursing/sitter in room Nurse Communication: Mobility status (Recommend +2 or stedy for transfers.) PT Visit Diagnosis: Unsteadiness on feet (R26.81);Other abnormalities of gait and  mobility (R26.89);Muscle weakness (generalized) (M62.81);History of falling (Z91.81);Difficulty in walking, not elsewhere classified (R26.2);Other symptoms and signs involving the nervous system (R29.898)    Time: 8483-8459 PT Time Calculation (min) (ACUTE ONLY): 24 min   Charges:   PT Evaluation $PT Eval Moderate Complexity: 1 Mod PT Treatments $Therapeutic Activity: 8-22 mins PT General Charges $$ ACUTE PT VISIT: 1 Visit         Leontine Roads,  PT, DPT [facility]  Rehabilitation Services Physical Therapist Office: [phone] Website: [email or web address]   Leontine GORMAN Roads 09/20/2023, 3:49 PM

## 2023-09-20 NOTE — Progress Notes (Signed)
 LTM VIDEO EEG discontinued - no skin breakdown at [facility].

## 2023-09-21 DIAGNOSIS — R569 Unspecified convulsions: Secondary | ICD-10-CM | POA: Diagnosis not present

## 2023-09-21 LAB — COMPREHENSIVE METABOLIC PANEL WITH GFR
ALT: 37 U/L (ref 0–44)
AST: 44 U/L — ABNORMAL HIGH (ref 15–41)
Albumin: 2.8 g/dL — ABNORMAL LOW (ref 3.5–5.0)
Alkaline Phosphatase: 51 U/L (ref 38–126)
Anion gap: 11 (ref 5–15)
BUN: 8 mg/dL (ref 8–23)
CO2: 28 mmol/L (ref 22–32)
Calcium: 8.3 mg/dL — ABNORMAL LOW (ref 8.9–10.3)
Chloride: 100 mmol/L (ref 98–111)
Creatinine, Ser: 0.71 mg/dL (ref 0.61–1.24)
GFR, Estimated: >60 mL/min (ref >60)
Glucose, Bld: 114 mg/dL — ABNORMAL HIGH (ref 70–99)
Potassium: 3.1 mmol/L — ABNORMAL LOW (ref 3.5–5.1)
Sodium: 139 mmol/L (ref 135–145)
Total Bilirubin: 0.8 mg/dL (ref 0.0–1.2)
Total Protein: 5.6 g/dL — ABNORMAL LOW (ref 6.5–8.1)

## 2023-09-21 LAB — CBC WITH DIFFERENTIAL/PLATELET
Abs Immature Granulocytes: 0.04 K/uL (ref 0.00–0.07)
Basophils Absolute: 0 K/uL (ref 0.0–0.1)
Basophils Relative: 1 %
Eosinophils Absolute: 0.2 K/uL (ref 0.0–0.5)
Eosinophils Relative: 3 %
HCT: 33.8 % — ABNORMAL LOW (ref 39.0–52.0)
Hemoglobin: 11.8 g/dL — ABNORMAL LOW (ref 13.0–17.0)
Immature Granulocytes: 1 %
Lymphocytes Relative: 26 %
Lymphs Abs: 1.9 K/uL (ref 0.7–4.0)
MCH: 33.1 pg (ref 26.0–34.0)
MCHC: 34.9 g/dL (ref 30.0–36.0)
MCV: 94.7 fL (ref 80.0–100.0)
Monocytes Absolute: 0.7 K/uL (ref 0.1–1.0)
Monocytes Relative: 10 %
Neutro Abs: 4.4 K/uL (ref 1.7–7.7)
Neutrophils Relative %: 59 %
Platelets: 207 K/uL (ref 150–400)
RBC: 3.57 MIL/uL — ABNORMAL LOW (ref 4.22–5.81)
RDW: 12.7 % (ref 11.5–15.5)
WBC: 7.4 K/uL (ref 4.0–10.5)
nRBC: 0 % (ref 0.0–0.2)

## 2023-09-21 LAB — MAGNESIUM: Magnesium: 1.8 mg/dL (ref 1.7–2.4)

## 2023-09-21 MED ORDER — POTASSIUM CHLORIDE CRYS ER 20 MEQ PO TBCR
40.0000 meq | EXTENDED_RELEASE_TABLET | Freq: Two times a day (BID) | ORAL | Status: DC
  Administered 2023-09-21 – 2023-09-22 (×3): 40 meq via ORAL
  Filled 2023-09-21 (×3): qty 2

## 2023-09-21 NOTE — Progress Notes (Signed)
   09/21/23 0800  Neurological  R Pupil Shape Round  R Pupil Reaction Brisk  L Pupil Size (mm) 3  L Pupil Shape Round  L Pupil Reaction Brisk  Motor Function/Sensation Assessment Grip;Dorsiflexion;Plantar flexion;Arm ataxia;Leg ataxia;Motor response;Sensation;Motor strength  R Hand Grip Moderate  L Hand Grip Moderate  R Foot Dorsiflexion Moderate  L Foot Dorsiflexion Moderate  R Foot Plantar Flexion Moderate  L Foot Plantar Flexion Moderate  R Finger to Nose (Point to [employer]) Smooth  L Finger to Nose (Point to [employer]) Smooth  R Heel to [employer] (Point to [employer]) Smooth  L Heel to [employer] to [employer]) [employer] Response Purposeful movement  RUE Sensation Full sensation  RUE Motor Strength 5  LUE Motor Response Purposeful movement  LUE Sensation Full sensation  LUE Motor Strength 5  RLE Motor Response Purposeful movement  RLE Sensation Full sensation  RLE Motor Strength 5  LLE Motor Response Purposeful movement  LLE Sensation Full sensation  LLE Motor Strength 5  Neuro Symptoms Forgetful

## 2023-09-21 NOTE — Progress Notes (Signed)
 OT Cancellation Note  Patient Details Name: Mario Waters MRN: [id number] DOB: 25-Feb-1940   Cancelled Treatment:    Reason Eval/Treat Not Completed: Other (comment). Pt in middle of eating his lunch.  Will try back as schedule allows for evaluation.  Donny BECKER OT Acute Rehabilitation Services Office [phone]    Rodgers Dorothyann Distel 09/21/2023, 1:01 PM

## 2023-09-21 NOTE — Plan of Care (Signed)
  Problem: Clinical Measurements: Goal: Ability to maintain clinical measurements within normal limits will improve Outcome: Progressing Goal: Will remain free from infection Outcome: Progressing Goal: Diagnostic test results will improve Outcome: Progressing Goal: Respiratory complications will improve Outcome: Progressing Goal: Cardiovascular complication will be avoided Outcome: Progressing   Problem: Activity: Goal: Risk for activity intolerance will decrease Outcome: Progressing   Problem: Nutrition: Goal: Adequate nutrition will be maintained Outcome: Progressing   Problem: Elimination: Goal: Will not experience complications related to bowel motility Outcome: Progressing Goal: Will not experience complications related to urinary retention Outcome: Progressing   Problem: Safety: Goal: Ability to remain free from injury will improve Outcome: Progressing   Problem: Skin Integrity: Goal: Risk for impaired skin integrity will decrease Outcome: Progressing   

## 2023-09-21 NOTE — TOC Initial Note (Signed)
 Transition of Care [facility]) - Initial/Assessment Note    Patient Details  Name: Mario Waters MRN: [id number] Date of Birth: 1940-08-12  [facility] CM/SW Contact:    Hartley KATHEE Robertson, LCSWA Phone Number: 09/21/2023, 1:23 PM  Clinical Narrative:                  CSW spoke with pt's spouse concerning SNF recs, pt's spouse not agreeable at this time to SNF workup, states she will need to speak with pt, states pt would probably be more agreeable to services at home. ICM will followup with spouse tomorrow.   Expected Discharge Plan: Home/Self Care     Patient Goals and CMS Choice            Expected Discharge Plan and Services                                              Prior Living Arrangements/Services                       Activities of Daily Living      Permission Sought/Granted                  Emotional Assessment              Admission diagnosis:  Status epilepticus (HCC) [G40.901] Seizure (HCC) [R56.9] Non-traumatic rhabdomyolysis [M62.82] Patient Active Problem List   Diagnosis Date Noted   Seizure (HCC) 09/18/2023   Elevated CK 09/18/2023   Leukocytosis 09/18/2023   Hyperlipidemia 09/18/2023   Chronic pain syndrome 09/18/2023   Osteoporosis 03/04/2019   Fracture of femoral neck, left, closed (HCC) 03/03/2019   Benign prostatic hyperplasia 12/08/2017   Primary osteoarthritis of both knees 06/26/2016   Peripheral neuropathy 09/21/2015   Seizure disorder (HCC) 09/21/2015   PCP:  Haze Kingfisher, MD Pharmacy:   [facility] [phone] - [place], [place] - [address] AT [address] [place] [address] Phone: [phone] Fax: [phone]     Social Drivers of Health (SDOH) Social History: SDOH Screenings   Food Insecurity: Patient Unable To Answer (09/18/2023)  Housing: Patient Unable To Answer (09/18/2023)  Transportation Needs: Patient Unable To Answer (09/18/2023)   Utilities: Patient Unable To Answer (09/18/2023)  Social Connections: Patient Unable To Answer (09/18/2023)  Tobacco Use: Medium Risk (09/18/2023)   SDOH Interventions:     Readmission Risk Interventions     No data to display

## 2023-09-21 NOTE — Evaluation (Signed)
 Occupational Therapy Evaluation Patient Details Name: Mario Waters MRN: [id number] DOB: 1940-08-23 Today's Date: 09/21/2023   History of Present Illness   83 y.o. male admitted 8/28 for seizures. PMH, hip fx 2021, neuropathy, and seizures.     Clinical Impressions This 83 yo male admitted with above presents to acute OT with PLOF of being totally independent with basic ADLs from a rollator standpoint. Currently he is setup-CGA for basic ADLs from RW standpoint. He will continue to benefit from acute OT with follow up HHOT and 24 hour S/prn A from wife and son at home.     If plan is discharge home, recommend the following:   A little help with walking and/or transfers;A little help with bathing/dressing/bathroom;Help with stairs or ramp for entrance;Assist for transportation;Assistance with cooking/housework;Direct supervision/assist for financial management;Direct supervision/assist for medications management     Functional Status Assessment   Patient has had a recent decline in their functional status and demonstrates the ability to make significant improvements in function in a reasonable and predictable amount of time.     Equipment Recommendations   None recommended by OT      Precautions/Restrictions   Precautions Precautions: Fall (seizure) Recall of Precautions/Restrictions: Impaired Restrictions Weight Bearing Restrictions Per Provider Order: No     Mobility Bed Mobility Overal bed mobility: Modified Independent, Independent             General bed mobility comments: HOB up for OOB, HOB down for back in bed    Transfers Overall transfer level: Needs assistance Equipment used: Rolling walker (2 wheels) Transfers: Sit to/from Stand Sit to Stand: Contact guard assist                  Balance Overall balance assessment: Mild deficits observed, not formally tested (in standing)                                         ADL  either performed or assessed with clinical judgement   ADL Overall ADL's : Needs assistance/impaired Eating/Feeding: Independent;Sitting (EOB)   Grooming: Contact guard assist;Standing;Wash/dry face   Upper Body Bathing: Set up;Sitting Upper Body Bathing Details (indicate cue type and reason): EOB Lower Body Bathing: Contact guard assist;Sit to/from stand Lower Body Bathing Details (indicate cue type and reason): EOB Upper Body Dressing : Set up;Sitting Upper Body Dressing Details (indicate cue type and reason): EOB Lower Body Dressing: Contact guard assist;Sit to/from stand Lower Body Dressing Details (indicate cue type and reason): EOB Toilet Transfer: Contact guard assist;Ambulation;Rolling walker (2 wheels) Toilet Transfer Details (indicate cue type and reason): simulated bed>down hallway to RN station and back(150 feet) with RW>back to room to sit EOB and then get back in bed Toileting- Clothing Manipulation and Hygiene: Contact guard assist;Sit to/from stand               Vision Patient Visual Report: No change from baseline              Pertinent Vitals/Pain Pain Assessment Pain Assessment: No/denies pain     Extremity/Trunk Assessment Upper Extremity Assessment Upper Extremity Assessment: Overall WFL for tasks assessed           Communication Communication Communication: Impaired Factors Affecting Communication:  (difficulty answering some questions)   Cognition Arousal: Alert Behavior During Therapy: Impulsive Cognition: Cognition impaired   Orientation impairments: Time, Situation (could not recall year (did recall month),  could not recall why he was here)   Memory impairment (select all impairments): Declarative long-term memory Attention impairment (select first level of impairment): Selective attention                     Following commands: Impaired Following commands impaired:  (impulsive at times)     Cueing    Cueing  Techniques: Verbal cues;Gestural cues              Home Living Family/patient expects to be discharged to:: Private residence Living Arrangements: Spouse/significant other and son Available Help at Discharge: Family; 24 hour Type of Home: House Home Access: Level entry     Home Layout: One level     Bathroom Shower/Tub: Tub/shower unit;Curtain   Firefighter: Standard     Home Equipment: Agricultural consultant (2 wheels);Cane - single point;Grab bars - toilet;Grab bars - tub/shower;Educational psychologist (4 wheels)          Prior Functioning/Environment               Mobility Comments: reports he uses a rollator at home ADLs Comments: reports he can do all of his ADLs at home from a rollator level    OT Problem List: Impaired balance (sitting and/or standing);Decreased cognition;Decreased safety awareness   OT Treatment/Interventions: Self-care/ADL training;DME and/or AE instruction;Balance training;Patient/family education      OT Goals(Current goals can be found in the care plan section)   Acute Rehab OT Goals Patient Stated Goal: to go home today OT Goal Formulation: With patient Time For Goal Achievement: 10/05/23 Potential to Achieve Goals: Good   OT Frequency:  Min 2X/week       AM-PAC OT 6 Clicks Daily Activity     Outcome Measure Help from another person eating meals?: None Help from another person taking care of personal grooming?: A Little Help from another person toileting, which includes using toliet, bedpan, or urinal?: A Little Help from another person bathing (including washing, rinsing, drying)?: A Little Help from another person to put on and taking off regular upper body clothing?: A Little Help from another person to put on and taking off regular lower body clothing?: A Little 6 Click Score: 19   End of Session Equipment Utilized During Treatment: Gait belt;Rolling walker (2 wheels) Nurse Communication:  (RN saw him walking in hallway  with me and A'd with getting everything set back up once he was back in bed)  Activity Tolerance: Patient tolerated treatment well Patient left: in bed;with call bell/phone within reach;with bed alarm set (seizure pads up on bedrails and matts on both sides of bed on floor)  OT Visit Diagnosis: Unsteadiness on feet (R26.81);Other abnormalities of gait and mobility (R26.89);Other symptoms and signs involving cognitive function                Time: 8597-8570 OT Time Calculation (min): 27 min Charges:  OT General Charges $OT Visit: 1 Visit OT Evaluation $OT Eval Moderate Complexity: 1 Mod OT Treatments $Self Care/Home Management : 8-22 mins  Mario Waters OT Acute Rehabilitation Services Office [phone]    Rodgers Mario Waters 09/21/2023, 2:43 PM

## 2023-09-21 NOTE — Progress Notes (Signed)
 TRH   ROUNDING   NOTE Mario Waters FMW:[id number]  DOB: 1940/11/09  DOA: 09/18/2023  PCP: Haze Kingfisher, MD  09/20/2023,1:13 PM  LOS: 2 days    Code Status: Full     from: Home current Dispo: Unclear         83 year old black male prior left hip fracture 02/2019 Underlying seizure disorder last known 01/2016 follows with [place] neurology on Keppra  and Dilantin  and sees Dr. Gregg Peripheral neuropathy Cervical spine surgery previously Sees pain management at [facility] medical for neuropathy arthritis and is on tramadol  for this   Brought in from home 8/28 due to unwitnessed seizures also found to be covered in coffee-ground emesis-unclear if they were grand mal-interatrial.  Was nonresponsive-did injure his toe-received Versed  in the ED Proceeded to have a subsequent seizure in the ED Neurology consulted-loaded with 4 g of Keppra  Keppra  level sent phenytoin  level 7.1, Keppra  level was less than 2   CT cervical spine CT head no acute anomalies MRI brain no acute anomaly EEG severe diffuse encephalopathy no seizure or epileptiform discharges Foot x-ray no injury Epileptologist and neurology saw the patient they performed a EEG 8/30 which was negative and felt patient was stabilized for discharge     Assessment  & Plan :    Provoked seizure secondary to noncompliance Keppra  phenytoin -both levels very subtherapeutic Tramadol  PTA DO NOT USE tramadol  for pain control in a patient with seizures Neurology saw the patient and felt current doses of AED Keppra  750 twice daily as well as Dilantin  100 at bedtime and 30 daily were sufficient-see below regarding gabapentin May require outpatient recheck on compliance/levels They have signed off as of 8/30 Elevated CK from seizure Reactive leukocytosis Both are resolving fairly well-saline lock IV today as long as eating and drinking well Moderate hypokalemia K-Dur 40 twice daily--- replace mag if needed Transaminitis Secondary to seizure-/rhabdo  and improved BPH Continue alfuzosin  10 AM, Flomax  0.4 daily Prior cervical surgery with chronic pain, peripheral neuropathy follows with [facility] medical No tramadol  ?  Use gabapentin for antiepileptic properties Hyperlipidemia   Data Reviewed:   Potassium 3.1 BUN/creatinine 8/0.7--AST/ALT 44/3.7 bilirubin 0.8 WBC 7 hemoglobin 11  DVT prophylaxis: Potassium  Status is: Inpatient Remains inpatient appropriate because:   Awaiting skilled and stability     Current Dispo: Skilled facility     Subjective:   Improved--can tell is in [place] name hospital.  Quite active-no distress Pulling off Purewick to use urinal    Objective + exam Vitals:   09/20/23 2026 09/20/23 2328 09/21/23 0336 09/21/23 0758  BP: 121/60 138/72 (!) 147/68 122/63  Pulse: 84 83 91 85  Resp: 17 17 17 14   Temp: 98.2 F (36.8 C) 98.4 F (36.9 C) 98.2 F (36.8 C) 98.8 F (37.1 C)  TempSrc: Oral Oral Oral Oral  SpO2: 98% 100% 100% 96%  Weight:      Height:       Filed Weights   09/18/23 2037  Weight: 57.8 kg     Examination: [place] a lil disoritented EOMI NCAT Power 5/5 upper lower extremities abdomen soft PureWick in place S1-S2 no murmur ROM intact    Scheduled Meds:  alfuzosin   10 mg Oral Q breakfast   enoxaparin  (LOVENOX ) injection  40 mg Subcutaneous Q24H   levETIRAcetam   750 mg Oral BID   Or   levETIRAcetam   750 mg Intravenous BID   pantoprazole   20 mg Oral Daily   phenytoin   100 mg Oral QHS   phenytoin   30 mg Oral Daily   potassium chloride   40 mEq Oral BID   tamsulosin   0.4 mg Oral Daily   Continuous Infusions:  Time 33  Jai-Gurmukh Kaimani Clayson, MD  [facility]

## 2023-09-22 DIAGNOSIS — R569 Unspecified convulsions: Secondary | ICD-10-CM | POA: Diagnosis not present

## 2023-09-22 LAB — CBC WITH DIFFERENTIAL/PLATELET
Abs Immature Granulocytes: 0.02 K/uL (ref 0.00–0.07)
Basophils Absolute: 0.1 K/uL (ref 0.0–0.1)
Basophils Relative: 1 %
Eosinophils Absolute: 0.2 K/uL (ref 0.0–0.5)
Eosinophils Relative: 3 %
HCT: 35.5 % — ABNORMAL LOW (ref 39.0–52.0)
Hemoglobin: 12.3 g/dL — ABNORMAL LOW (ref 13.0–17.0)
Immature Granulocytes: 0 %
Lymphocytes Relative: 36 %
Lymphs Abs: 2.2 K/uL (ref 0.7–4.0)
MCH: 33.1 pg (ref 26.0–34.0)
MCHC: 34.6 g/dL (ref 30.0–36.0)
MCV: 95.4 fL (ref 80.0–100.0)
Monocytes Absolute: 0.7 K/uL (ref 0.1–1.0)
Monocytes Relative: 11 %
Neutro Abs: 2.9 K/uL (ref 1.7–7.7)
Neutrophils Relative %: 49 %
Platelets: 250 K/uL (ref 150–400)
RBC: 3.72 MIL/uL — ABNORMAL LOW (ref 4.22–5.81)
RDW: 12.8 % (ref 11.5–15.5)
WBC: 6 K/uL (ref 4.0–10.5)
nRBC: 0 % (ref 0.0–0.2)

## 2023-09-22 LAB — COMPREHENSIVE METABOLIC PANEL WITH GFR
ALT: 51 U/L — ABNORMAL HIGH (ref 0–44)
AST: 53 U/L — ABNORMAL HIGH (ref 15–41)
Albumin: 3 g/dL — ABNORMAL LOW (ref 3.5–5.0)
Alkaline Phosphatase: 60 U/L (ref 38–126)
Anion gap: 9 (ref 5–15)
BUN: 10 mg/dL (ref 8–23)
CO2: 30 mmol/L (ref 22–32)
Calcium: 8.7 mg/dL — ABNORMAL LOW (ref 8.9–10.3)
Chloride: 98 mmol/L (ref 98–111)
Creatinine, Ser: 0.85 mg/dL (ref 0.61–1.24)
GFR, Estimated: >60 mL/min (ref >60)
Glucose, Bld: 118 mg/dL — ABNORMAL HIGH (ref 70–99)
Potassium: 4.5 mmol/L (ref 3.5–5.1)
Sodium: 137 mmol/L (ref 135–145)
Total Bilirubin: 0.7 mg/dL (ref 0.0–1.2)
Total Protein: 6.3 g/dL — ABNORMAL LOW (ref 6.5–8.1)

## 2023-09-22 LAB — PHENYTOIN LEVEL, TOTAL: Phenytoin Lvl: 7.5 ug/mL — ABNORMAL LOW (ref 10.0–20.0)

## 2023-09-22 MED ORDER — PHENYTOIN SODIUM EXTENDED 100 MG PO CAPS: 100.0000 mg | ORAL_CAPSULE | Freq: Every day | ORAL | 11 refills | Status: DC

## 2023-09-22 MED ORDER — PHENYTOIN SODIUM EXTENDED 30 MG PO CAPS: 30.0000 mg | ORAL_CAPSULE | Freq: Every day | ORAL | 11 refills | Status: DC

## 2023-09-22 MED ORDER — LEVETIRACETAM 750 MG PO TABS: 750.0000 mg | ORAL_TABLET | Freq: Two times a day (BID) | ORAL | 11 refills | Status: DC

## 2023-09-22 NOTE — Progress Notes (Signed)
 Occupational Therapy Treatment Patient Details Name: Mario Waters MRN: [id number] DOB: January 02, 1941 Today's Date: 09/22/2023   History of present illness 83 y.o. male admitted 8/28 for seizures. PMH, hip fx 2021, neuropathy, and seizures.   OT comments  This 83 yo male seen today and is continuing to do good with overall mobility and ADLs. Yesterday was CGA at [facility] for ADLs, today S. He is to D/C today and HHOT has been recommended. Pt lives with wife and son who per my conversation yesterday over phone with wife --they can provide S and up to Min A (son). Acute OT will sign off.      If plan is discharge home, recommend the following:  Direct supervision/assist for financial management;Direct supervision/assist for medications management;Assistance with cooking/housework;Assist for transportation;Help with stairs or ramp for entrance;A little help with walking and/or transfers;A little help with bathing/dressing/bathroom   Equipment Recommendations  None recommended by OT       Precautions / Restrictions Precautions Precautions: Fall Recall of Precautions/Restrictions: Impaired Restrictions Weight Bearing Restrictions Per Provider Order: No       Mobility Bed Mobility               General bed mobility comments: OOB in chair    Transfers Overall transfer level: Needs assistance Equipment used: Rolling walker (2 wheels) Transfers: Sit to/from Stand Sit to Stand: Supervision                 Balance Overall balance assessment: Mild deficits observed, not formally tested (in standing)                                         ADL either performed or assessed with clinical judgement   ADL Overall ADL's : Needs assistance/impaired                         Toilet Transfer: Ambulation;Rolling walker (2 wheels);Supervision/safety;Comfort height toilet;Grab bars   Toileting- Clothing Manipulation and Hygiene: Supervision/safety;Sit to/from  stand              Extremity/Trunk Assessment Upper Extremity Assessment Upper Extremity Assessment: Overall WFL for tasks assessed            Vision Patient Visual Report: No change from baseline           Communication Communication Communication: No apparent difficulties   Cognition Arousal: Alert Behavior During Therapy: Flat affect                                 Following commands: Intact        Cueing   Cueing Techniques: Verbal cues             Pertinent Vitals/ Pain       Pain Assessment Pain Assessment: No/denies pain  Home Living Family/patient expects to be discharged to:: Private residence Living Arrangements: Spouse/significant other                                          Frequency  Min 2X/week        Progress Toward Goals  OT Goals(current goals can now be found in the care plan section)  Progress towards OT goals: Progressing toward  goals  Acute Rehab OT Goals Patient Stated Goal: to go home today OT Goal Formulation: With patient Time For Goal Achievement: 10/05/23 Potential to Achieve Goals: Good         AM-PAC OT 6 Clicks Daily Activity     Outcome Measure   Help from another person eating meals?: None Help from another person taking care of personal grooming?: A Little Help from another person toileting, which includes using toliet, bedpan, or urinal?: A Little Help from another person bathing (including washing, rinsing, drying)?: A Little Help from another person to put on and taking off regular upper body clothing?: A Little Help from another person to put on and taking off regular lower body clothing?: A Little 6 Click Score: 19    End of Session Equipment Utilized During Treatment: Gait belt;Rolling walker (2 wheels)  OT Visit Diagnosis: Unsteadiness on feet (R26.81)   Activity Tolerance Patient tolerated treatment well   Patient Left in chair;with call bell/phone within  reach;with chair alarm set           Time: 1049-1101 OT Time Calculation (min): 12 min  Charges: OT General Charges $OT Visit: 1 Visit OT Treatments $Self Care/Home Management : 8-22 mins  Donny BECKER OT Acute Rehabilitation Services Office [phone]    [place]gers Dorothyann Distel 09/22/2023, 12:53 PM

## 2023-09-22 NOTE — Progress Notes (Signed)
 Phenytoin  Initial Consult Indication: Seizure disorder  Allergies  Allergen Reactions   Tramadol      seizure    Patient Measurements: Height: 5' 11 (180.3 cm) Weight: 57.8 kg (127 lb 6.8 oz) IBW/kg (Calculated) : 75.3 TPN AdjBW (KG): 57.8 Body mass index is 17.77 kg/m.   Vital signs: Temp: 98.2 F (36.8 C) (09/01 0748) Temp Source: Oral (09/01 0748) BP: 154/75 (09/01 0748) Pulse Rate: 92 (09/01 0748)  Labs: Lab Results  Component Value Date/Time   Albumin 3.0 (L) 09/22/2023 0451   Phenytoin  Lvl 7.5 (L) 09/22/2023 0906   Lab Results  Component Value Date   PHENYTOIN  7.5 (L) 09/22/2023   Estimated Creatinine Clearance: 51.9 mL/min (by C-G formula based on SCr of 0.85 mg/dL).     Assessment: Patient has been on phenytoin  100mg  at bedtime for years with levels drawn around 9am resulting at 12.9-17.7 in therapeutic range. Levels ~12 hours after dose are peak levels. Ideally, trough levels should be drawn with goal of 10-20. Level on admission at 13:47 was 7.1 and subtherapeutic. Unclear compliance as patient is nonverbal at this time. Patient received keppra  IV load in ED. On med hx, family reported patient might have taken the last dose last week. Suspect patient may be subtherapeutic in the latter half of each day. Given seizures on admission, small load of 100 mg x1 was given (8/29)  then dose was increased to 30 mg in the morning and 100 mg at bedtime.   Corrected phenytoin  level (if needed): corrected to 10.7 for albumin of 3 and measured level of 7.5. Seizure activity: 8/28 breakthrough seizures Significant potential drug interactions: none  Goals of care: Total phenytoin  level: 10-20 mcg/ml Free phenytoin  level: 1-2 mcg/ml  Plan:  Continue phenytoin  ER 30 mg in the morning and ER 100mg  at bedtime F/u trough level 9/3 (5 days from dose change) Anticipated maintenance dose: 30mg  in the morning and 100mg  at bedtime or if patient will have issues with adherence then  consider 150mg  ER at bedtime  Pharmacy will continue to follow regarding obtaining total phenytoin  levels and dose adjustments as indicated.  WENDI Amon Rocher, PharmD PGY-1 Pharmacy Resident [facility] 09/22/2023 10:46 AM   Please check AMION for all [facility] Pharmacy phone numbers After 10:00 PM, call Main Pharmacy [phone]

## 2023-09-22 NOTE — Discharge Summary (Signed)
 Physician Discharge Summary  Mario Waters FMW:[id number] DOB: 04/29/1940 DOA: 09/18/2023  PCP: Haze Kingfisher, MD  Admit date: 09/18/2023 Discharge date: 09/22/2023  Time spent: 70 minutes  Recommendations for Outpatient Follow-up:  Chem-12 1 week CC Dr. 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 Gregg Peripheral neuropathy Cervical spine surgery previously Sees pain management at [facility] medical for neuropathy arthritis and is on tramadol  for this   Brought in from home 8/28 due to unwitnessed seizures also found to be covered in coffee-ground emesis-unclear if they were grand mal-interatrial.  Was nonresponsive-did injure his toe-received Versed  in the ED Proceeded to have a subsequent seizure in the ED Neurology consulted-loaded with 4 g of Keppra  Keppra  level sent phenytoin  level 7.1, Keppra  level was less than 2   CT cervical spine CT head no acute anomalies MRI brain no acute anomaly EEG severe diffuse encephalopathy no seizure or epileptiform discharges Foot x-ray no injury Epileptologist and neurology saw the patient they performed a EEG 8/30 which was negative and felt patient was stabilized for discharge      Assessment  & Plan :      Provoked seizure secondary to noncompliance Keppra  phenytoin -both levels very subtherapeutic Tramadol  PTA DO NOT USE tramadol  for pain control in a patient with seizures Neurology saw the patient and felt  current doses of AED Keppra  750 twice daily as well as Dilantin  100 at bedtime and 30 daily were sufficient-gabapentin discontinued by them May require outpatient recheck on compliance/levels--- needs Chem-12 1 week--CC Dr. Gregg at discharge Elevated CK from seizure overall resolved Reactive leukocytosis Both are resolving fairly well-saline lock IV today as long as eating and drinking well Moderate hypokalemia K-Dur 40 given aggressively during hospital stay and this problem resolved-outpatient follow-up with labs Transaminitis Secondary to seizure-/rhabdo and improved-no further workup currently Consider Chem-12 in a week BPH Continue alfuzosin  10 AM, Flomax  0.4 daily Prior cervical surgery with chronic pain, peripheral neuropathy follows with [facility] medical No tramadol  in the future ?  No effective gabapentin in the outpatient setting-pain control challenging and would defer to neurology in [facility] to ensure he is on safe medications Hyperlipidemia  Discharge Exam: Vitals:   09/22/23 0348 09/22/23 0748  BP: 137/68 (!) 154/75  Pulse: 80 92  Resp: 19 19  Temp: 98.1 F (36.7 C) 98.2 F (36.8 C)  SpO2: 98% 98%    Subj on day of d/c   Coherent awake alert Much more able to tell me where he is time of day etc. Walked 300 feet Chest is clear no wheeze rales rhonchi ROM intact Power 5/5   Discharge Instructions   Discharge Instructions     Diet - low sodium heart healthy   Complete by: As directed    Discharge instructions   Complete by: As directed    Please make sure that you take your seizure medicines Keppra  and phenytoin  as directed DO NOT TAKE Tramadol --- please follow-up with [facility] medical and tell them that you came in  with seizures so that they are aware and can find a different medicine to help you with your pain we will get some home health therapy to come out to your home   Increase activity slowly   Complete by: As directed       Allergies  as of 09/22/2023       Reactions   Tramadol     seizure        Medication List     STOP taking these medications    cyclobenzaprine 5 MG tablet Commonly known as: FLEXERIL   enoxaparin  40 MG/0.4ML injection Commonly known as: LOVENOX        TAKE these medications    alfuzosin  10 MG 24 hr tablet Commonly known as: UROXATRAL  alfuzosin  ER 10 mg tablet,extended release 24 hr  TAKE 1 TABLET BY MOUTH EVERY DAY   levETIRAcetam  750 MG tablet Commonly known as: KEPPRA  Take 1 tablet (750 mg total) by mouth 2 (two) times daily. What changed:  medication strength how much to take   pantoprazole  20 MG tablet Commonly known as: PROTONIX  Take 20 mg by mouth daily.   phenytoin  100 MG ER capsule Commonly known as: DILANTIN  Take 1 capsule (100 mg total) by mouth at bedtime. What changed: Another medication with the same name was added. Make sure you understand how and when to take each.   phenytoin  30 MG ER capsule Commonly known as: DILANTIN  Take 1 capsule (30 mg total) by mouth daily. Start taking on: September 23, 2023 What changed: You were already taking a medication with the same name, and this prescription was added. Make sure you understand how and when to take each.   tamsulosin  0.4 MG Caps capsule Commonly known as: FLOMAX  Take 0.4 mg by mouth daily.               Durable Medical Equipment  (From admission, onward)           Start     Ordered   09/22/23 1117  DME Walker  Once       Question Answer Comment  Walker: With 5 Inch Wheels   Patient needs a walker to treat with the following condition Ataxia      09/22/23 1117           Allergies  Allergen Reactions   Tramadol      seizure    Follow-up Information     Care, [facility] Follow up.   Specialty: Home Health Services Contact information: [address] [place] [address] [phone]                  The results of significant diagnostics from  this hospitalization (including imaging, microbiology, ancillary and laboratory) are listed below for reference.    Significant Diagnostic Studies: DG CHEST PORT 1 VIEW Result Date: 09/19/2023 CLINICAL DATA:  Pneumonia.  Fall. EXAM: PORTABLE CHEST 1 VIEW COMPARISON:  09/23/2022 FINDINGS: Atherosclerotic calcification of the aortic arch. Cardiac and mediastinal margins appear normal. The lungs appear clear.  No blunting of the costophrenic angles. No significant bony findings. IMPRESSION: 1. No active cardiopulmonary disease is radiographically apparent. 2. Aortic Atherosclerosis (ICD10-I70.0). Electronically Signed   By: Ryan Salvage M.D.   On: 09/19/2023 11:12   Overnight EEG with video Result Date: 09/19/2023 Shelton Arlin KIDD, MD     09/20/2023  6:25 AM Patient Name: Abrahim Sargent MRN: [id number] Epilepsy Attending: Arlin KIDD Shelton Referring Physician/Provider: Waddell Karna LABOR, NP Duration: 09/18/2023 2350 to 09/19/2023  2350 Patient history:  83 y.o. male with hx of seizures on Dilantin  and Keppra , memory deficit and neuropathy who presents to [facility] via EMS for having multiple seizures today.  EEG to evaluate for seizure Level of alertness: awake/ lethargic , asleep AEDs during EEG study: LEV, PHT Technical aspects: This EEG study was done with scalp electrodes positioned according to the 10-20 International system of electrode placement. Electrical activity was reviewed with band pass filter of 1-70Hz , sensitivity of 7 uV/mm, display speed of 29mm/sec with a 60Hz  notched filter applied as appropriate. EEG data were recorded continuously and digitally stored.  Video monitoring was available and reviewed as appropriate. Description: EEG showed continuous generalized 3 to 6 Hz theta-delta slowing admixed with 15-18hz  beta activity. Sleep was characterized by sleep spindles (12-14hz ), maximal fronto-central region. Hyperventilation and photic stimulation were not performed.   ABNORMALITY - Continuous slow,  generalized IMPRESSION: This study is suggestive of severe diffuse encephalopathy. No seizures or epileptiform discharges were seen throughout the recording. Arlin MALVA Krebs   MR BRAIN WO CONTRAST Result Date: 09/18/2023 CLINICAL DATA:  seizure EXAM: MRI HEAD WITHOUT CONTRAST TECHNIQUE: Multiplanar, multiecho pulse sequences of the brain and surrounding structures were obtained without intravenous contrast. COMPARISON:  CT head from earlier today. FINDINGS: Brain: No acute infarction, hemorrhage, hydrocephalus, extra-axial collection or mass lesion. Mild-to-moderate T2/FLAIR hyperintensities in the white matter, compatible with chronic microvascular ischemic disease. Vascular: Major arterial flow voids are maintained skull base. Skull and upper cervical spine: Normal marrow signal. Sinuses/Orbits: Clear sinuses.  No acute orbital findings. Other: No mastoid effusions. IMPRESSION: No evidence of acute intracranial abnormality. Electronically Signed   By: Gilmore GORMAN Molt M.D.   On: 09/18/2023 17:23   CT Cervical Spine Wo Contrast Result Date: 09/18/2023 CLINICAL DATA:  Neck trauma EXAM: CT CERVICAL SPINE WITHOUT CONTRAST TECHNIQUE: Multidetector CT imaging of the cervical spine was performed without intravenous contrast. Multiplanar CT image reconstructions were also generated. RADIATION DOSE REDUCTION: This exam was performed according to the departmental dose-optimization program which includes automated exposure control, adjustment of the mA and/or kV according to patient size and/or use of iterative reconstruction technique. COMPARISON:  09/23/2022 FINDINGS: Alignment: Slight anterolisthesis of C3 on C4 and C4 on C5, stable since prior study. Skull base and vertebrae: No acute fracture. No primary bone lesion or focal pathologic process. Soft tissues and spinal canal: No prevertebral fluid or swelling. No visible canal hematoma. Disc levels: Posterior fusion and laminectomy changes from C3-C5. Mild  diffuse degenerative disc disease. Upper chest: No acute findings Other: None IMPRESSION: Degenerative and postoperative changes.  No acute bony abnormality. Electronically Signed   By: Franky Crease M.D.   On: 09/18/2023 14:42   CT Head Wo Contrast Result Date: 09/18/2023 CLINICAL DATA:  Seizure, fall EXAM: CT HEAD WITHOUT CONTRAST TECHNIQUE: Contiguous axial images were obtained from the base of the skull through the vertex without intravenous contrast. RADIATION DOSE REDUCTION: This exam was performed according to the departmental dose-optimization program which includes automated exposure control, adjustment of the mA and/or kV according to patient size and/or use of iterative reconstruction technique. COMPARISON:  09/23/2022 FINDINGS: Brain: There is atrophy and chronic small vessel disease changes. No acute intracranial abnormality. Specifically, no hemorrhage, hydrocephalus, mass lesion, acute infarction, or significant intracranial injury. Vascular: No hyperdense vessel or unexpected calcification. Skull: No acute calvarial abnormality. Sinuses/Orbits: No acute findings Other: None IMPRESSION: Atrophy, chronic microvascular disease. No acute intracranial abnormality. Electronically Signed   By: Franky Crease M.D.  On: 09/18/2023 14:40   DG Foot Complete Left Result Date: 09/18/2023 EXAM: 3 or more VIEW(S) XRAY OF THE LEFT FOOT 09/18/2023 02:18:00 PM COMPARISON: None available. CLINICAL HISTORY: Foot injury. Per-triage notes: Pt BIB [facility] from home d/t unwitnessed seizures while at home with family. Family told EMS he did not want to come after the first one today \\T \ was unable to report when each happened but the son reports when he found his unresponsive \\T \ covered in coffee ground colored emesis approx. 1.5 hr before arrival to ED he decided to call 911. Pt does take Keppra , unknown if any doses have been missed. EMS gave pt 2.5 midazolam  via 22g Rt hand. \T\ at time of arrival reports he was  blinking his eyes \\T \ squeezing hands on demand. Did not have any full body seizures with them. 161/77, 96 bpm, 99% on 3L O2, CO2 29, 21 resp, CBG 118. FINDINGS: BONES AND JOINTS: Second through fifth hammertoe deformities. Mild hallux valgus deformity with degenerative changes at the first metatarsophalangeal joint. Moderate degenerative changes involving the fifth metatarsal phalangeal joint with associated chronic posttraumatic deformity involving the head of the fifth metatarsal bone. No acute fracture. SOFT TISSUES: Vascular calcifications noted. IMPRESSION: 1. No acute findings. 2. Moderate degenerative changes involving the fifth metatarsal phalangeal joint with associated chronic posttraumatic deformity involving the head of the fifth metatarsal bone. 3. Second through fifth hammertoe deformities. Electronically signed by: Waddell Calk MD 09/18/2023 02:26 PM EDT RP Workstation: [id number]    Microbiology: No results found for this or any previous visit (from the past 240 hours).   Labs: Basic Metabolic Panel: Recent Labs  Lab 09/18/23 1347 09/19/23 0249 09/20/23 0529 09/21/23 0637 09/21/23 0638 09/22/23 0451  NA 137 135 137  --  139 137  K 4.1 3.3* 3.4*  --  3.1* 4.5  CL 94* 96* 97*  --  100 98  CO2 26 25 28   --  28 30  GLUCOSE 159* 109* 116*  --  114* 118*  BUN 10 11 12   --  8 10  CREATININE 0.85 0.81 0.78  --  0.71 0.85  CALCIUM  9.6 8.7* 8.8*  --  8.3* 8.7*  MG  --   --   --  1.8  --   --    Liver Function Tests: Recent Labs  Lab 09/18/23 1347 09/19/23 0249 09/20/23 0529 09/21/23 0638 09/22/23 0451  AST 60* 90* 68* 44* 53*  ALT 47* 48* 43 37 51*  ALKPHOS 74 61 55 51 60  BILITOT 1.1 1.6* 1.0 0.8 0.7  PROT 7.9 6.6 6.6 5.6* 6.3*  ALBUMIN 4.5 3.6 3.5 2.8* 3.0*   No results for input(s): LIPASE, AMYLASE in the last 168 hours. No results for input(s): AMMONIA in the last 168 hours. CBC: Recent Labs  Lab 09/18/23 1347 09/19/23 0751 09/20/23 0529  09/21/23 0638 09/22/23 0451  WBC 13.9* 14.1* 10.3 7.4 6.0  NEUTROABS 11.9*  --  6.8 4.4 2.9  HGB 15.2 13.2 14.1 11.8* 12.3*  HCT 44.0 37.5* 40.5 33.8* 35.5*  MCV 93.8 93.5 94.4 94.7 95.4  PLT 281 233 226 207 250   Cardiac Enzymes: Recent Labs  Lab 09/18/23 1347 09/19/23 0249 09/20/23 1721  CKTOTAL 2,375* 4,243* 1,534*   BNP: BNP (last 3 results) No results for input(s): BNP in the last 8760 hours.  ProBNP (last 3 results) No results for input(s): PROBNP in the last 8760 hours.  CBG: Recent Labs  Lab 09/18/23 1347  GLUCAP 167*  Signed:  Colen Grimes MD   [facility] 09/22/2023, 11:17 AM

## 2023-09-22 NOTE — Progress Notes (Signed)
 OT Cancellation Note  Patient Details Name: Laquinton Bihm MRN: [id number] DOB: [birth year]   Cancelled Treatment:    Reason Eval/Treat Not Completed: Other (comment)Pt just got his breakfast--will try back as schedule allows.  Donny BECKER OT Acute Rehabilitation Services Office [phone]    [place] Dorothyann Distel 09/22/2023, 8:28 AM

## 2023-09-22 NOTE — Progress Notes (Signed)
 Physical Therapy Treatment Patient Details Name: Mario Waters MRN: [id number] DOB: 1940/08/11 Today's Date: 09/22/2023   History of Present Illness 83 y.o. male admitted 8/28 for seizures. PMH, hip fx 2021, neuropathy, and seizures.    PT Comments  Pt with significant improvement in performance in comparison to initial evaluation on 8/30. Pt received sitting up in chair; per RN was able to complete bed bath with set up assist. Pt able to transfer and ambulate 380 ft with a Rollator without physical assist. Mild dynamic instability and veering with obstacle negotiation. Will benefit from follow up HHPT for further balance training and strengthening.    If plan is discharge home, recommend the following: Assistance with cooking/housework;Assist for transportation;Help with stairs or ramp for entrance;Supervision due to cognitive status   Can travel by private vehicle     Yes  Equipment Recommendations  None recommended by PT    Recommendations for Other Services       Precautions / Restrictions Precautions Precautions: Fall Restrictions Weight Bearing Restrictions Per Provider Order: No     Mobility  Bed Mobility               General bed mobility comments: OOB in chair    Transfers Overall transfer level: Needs assistance Equipment used: Rolling walker (2 wheels) Transfers: Sit to/from Stand Sit to Stand: Supervision                Ambulation/Gait Ambulation/Gait assistance: Contact guard assist Gait Distance (Feet): 380 Feet Assistive device: Rollator (4 wheels) Gait Pattern/deviations: Step-through pattern, Narrow base of support       General Gait Details: Good stride length, slightly narrower BOS, veering towards obstacles but able to navigate   Stairs             Wheelchair Mobility     Tilt Bed    Modified Rankin (Stroke Patients Only)       Balance Overall balance assessment: Mild deficits observed, not formally tested                                           Communication Communication Communication: No apparent difficulties  Cognition Arousal: Alert Behavior During Therapy: Flat affect   PT - Cognitive impairments: Awareness, Memory                       PT - Cognition Comments: Pt oriented to day of week and holiday with increased time to respond. Following commands: Intact      Cueing Cueing Techniques: Verbal cues  Exercises      General Comments        Pertinent Vitals/Pain Pain Assessment Pain Assessment: No/denies pain    Home Living                          Prior Function            PT Goals (current goals can now be found in the care plan section) Acute Rehab PT Goals Patient Stated Goal: None stated Potential to Achieve Goals: Good Progress towards PT goals: Progressing toward goals    Frequency    Min 2X/week      PT Plan      Co-evaluation              AM-PAC PT 6 Clicks Mobility   Outcome  Measure  Help needed turning from your back to your side while in a flat bed without using bedrails?: None Help needed moving from lying on your back to sitting on the side of a flat bed without using bedrails?: A Little Help needed moving to and from a bed to a chair (including a wheelchair)?: A Little Help needed standing up from a chair using your arms (e.g., wheelchair or bedside chair)?: A Little Help needed to walk in hospital room?: A Little Help needed climbing 3-5 steps with a railing? : A Little 6 Click Score: 19    End of Session Equipment Utilized During Treatment: Gait belt Activity Tolerance: Patient tolerated treatment well Patient left: in chair;with call bell/phone within reach;with chair alarm set Nurse Communication: Mobility status PT Visit Diagnosis: Unsteadiness on feet (R26.81);Other abnormalities of gait and mobility (R26.89);Muscle weakness (generalized) (M62.81);History of falling (Z91.81);Difficulty in  walking, not elsewhere classified (R26.2);Other symptoms and signs involving the nervous system (R29.898)     Time: 8981-8966 PT Time Calculation (min) (ACUTE ONLY): 15 min  Charges:    $Therapeutic Activity: 8-22 mins PT General Charges $$ ACUTE PT VISIT: 1 Visit                     Aleck Daring, PT, DPT Acute Rehabilitation Services Office [phone]    Alayne ONEIDA Daring 09/22/2023, 10:38 AM

## 2023-09-22 NOTE — Progress Notes (Signed)
 Transition of Care [facility]) - Inpatient Brief Assessment   Patient Details  Name: Mario Waters MRN: [id number] Date of Birth: Jun 15, 1940  [facility]) CM/SW Contact:    Rosaline JONELLE Joe, RN Phone Number: 09/22/2023, 10:55 AM   Clinical Narrative: Patient plans to discharge home with spouse today.  I called and spoke with the spouse by phone and offered Medicare choice regarding home health and spouse preferred [facility].  Home health orders placed to be co-signed by the MD.  [facility] accepted [facility] services.  Family plans to provide transportation to home via car.   Transition of Care Asessment: Insurance and Status: (P) Insurance coverage has been reviewed Patient has primary care physician: (P) Yes Home environment has been reviewed: (P) from home with spouse Prior level of function:: (P) self Prior/Current Home Services: (P) No current home services Social Drivers of Health Review: (P) SDOH reviewed interventions complete Readmission risk has been reviewed: (P) Yes Transition of care needs: (P) transition of care needs identified, TOC will continue to follow

## 2023-09-26 ENCOUNTER — Telehealth: Payer: Self-pay | Admitting: Neurology

## 2023-09-26 NOTE — Telephone Encounter (Signed)
 Pt has called to r/s missed appointment

## 2023-10-06 ENCOUNTER — Telehealth: Payer: Self-pay | Admitting: Neurology

## 2023-10-06 NOTE — Telephone Encounter (Signed)
 Lvm 1st attempt by hf 10/06/23

## 2023-10-06 NOTE — Telephone Encounter (Signed)
 Patient said have been out of the hospital for a week . Need refill for phenytoin  (DILANTIN ) 100 MG ER capsule send to [id number]. Confused on what dosage is to take for Phenytoin . What has been taking 750 mg for Phentoin since left the hospital. Would like a call from the nurse.

## 2023-10-07 NOTE — Telephone Encounter (Signed)
 I talked with Dr. Gregg, wants to have him added to his schedule next week. Thanks

## 2023-10-07 NOTE — Telephone Encounter (Signed)
 Call to patient, no answer. Left message to return call

## 2023-10-07 NOTE — Telephone Encounter (Signed)
 Presented to the ER 8/28 via EMS for multiple seizures.  Family found him unresponsive and covered in emesis.  Had seizure in the ER.  Keppra  level was less than 2.  Felt seizure secondary to medication noncompliance.  Dilantin  level was 7.1.  Long-term EEG showed moderate diffuse encephalopathy.  He was on home Keppra  500 mg twice a day, Dilantin  100 mg at bedtime.  Keppra  was increased 750 mg twice a day, extra 30 mg Dilantin  AM.    Discharged with Keppra  750 mg twice a day, Dilantin  100 mg + 30 mg.

## 2023-10-07 NOTE — Telephone Encounter (Signed)
 Lvm 2nd attempt by hf

## 2023-10-08 NOTE — Telephone Encounter (Signed)
 Call to patient and wife, agreeable to  9/24 at 145pm

## 2023-10-08 NOTE — Telephone Encounter (Signed)
 Called wife and lvm 4th attempt by hf but 1st attempt on her call

## 2023-10-15 ENCOUNTER — Ambulatory Visit: Admitting: Neurology

## 2023-10-15 ENCOUNTER — Encounter: Payer: Self-pay | Admitting: Neurology

## 2023-10-15 VITALS — BP 94/60 | HR 91 | Ht 71.0 in | Wt 130.0 lb

## 2023-10-15 DIAGNOSIS — Z5181 Encounter for therapeutic drug level monitoring: Secondary | ICD-10-CM | POA: Diagnosis not present

## 2023-10-15 DIAGNOSIS — G40909 Epilepsy, unspecified, not intractable, without status epilepticus: Secondary | ICD-10-CM | POA: Diagnosis not present

## 2023-10-15 MED ORDER — PHENYTOIN 50 MG PO CHEW: 50.0000 mg | CHEWABLE_TABLET | Freq: Every day | ORAL | 4 refills | Status: AC

## 2023-10-15 MED ORDER — PHENYTOIN SODIUM EXTENDED 100 MG PO CAPS: 100.0000 mg | ORAL_CAPSULE | Freq: Every day | ORAL | 4 refills | Status: AC

## 2023-10-15 MED ORDER — LEVETIRACETAM 750 MG PO TABS: 750.0000 mg | ORAL_TABLET | Freq: Two times a day (BID) | ORAL | 4 refills | Status: AC

## 2023-10-15 NOTE — Progress Notes (Signed)
 Patient: Mario Waters Date of Birth: [birth year]  Reason for Visit: Follow up for seizures, peripheral neuropathy History from: Patient Primary Neurologist: Willis/Siobhan Zaro  ASSESSMENT AND PLAN 83 y.o. year old male   1.  Epilepsy 2.  History of peripheral neuropathy 3.  Gait disturbance  -Last seizure September 18 2023 in the setting of not getting his medications from the pharmacy in time.  Will continue Keppra  750 mg twice daily and increase his phenytoin  150 mg nightly as previously.  90 days supply sent to pharmacy -Will check labs today, check labs today  -Advised patient to call us  if he is not getting his medication on time -Follow-up in 1 year or sooner if needed, will call for seizures  Meds ordered this encounter  Medications   phenytoin  (DILANTIN ) 50 MG tablet    Sig: Chew 1 tablet (50 mg total) by mouth daily.    Dispense:  90 tablet    Refill:  4   phenytoin  (DILANTIN ) 100 MG ER capsule    Sig: Take 1 capsule (100 mg total) by mouth at bedtime.    Dispense:  90 capsule    Refill:  4   levETIRAcetam  (KEPPRA ) 750 MG tablet    Sig: Take 1 tablet (750 mg total) by mouth 2 (two) times daily.    Dispense:  180 tablet    Refill:  4   HISTORY OF PRESENT ILLNESS: Today 10/15/23 Patient presents today for follow-up, last visit was a year ago since then he has been doing well until the month of August when he ran out of medication and did have a breakthrough seizure.  He was found down unresponsive, taken to the hospital, his antiseizure medication levels were low, phenytoin  level 7.1 and Keppra  level less than 2.  He tells me that he was trying to work with his pharmacy and the office to get his medications but it was not sent in time causing the breakthrough seizure.  Since discharge from the hospital his medications were restarted but he was given phenytoin  100 mg instead of 150.  He tells me in the past he used to take 150 mg nightly.  On his medication list, there is  phenytoin  30 mg listed but he tells me he only takes the 100 mg at night and he would like to go back to his 150 mg nightly.   INTERVAL HISTORY 09/14/2022 Labs in August 2023 Dilantin  level 14.2, Keppra  level 16.2. Remains on Keppra  and Dilantin . No recent seizures. He thinks last seizure was in 2018. He drives. His feet are numb from neuropathy. Uses cane. Feels crawling feeling in his feet. [place] on the curb a month ago carrying groceries in. Paresthesia bothersome with prolonged standing, improves with sitting. Seeing pain management at [facility] for neuropathy, arthritis. Tried Lyrica, gabapentin. Now on tramadol . Takes Klonopin  at night for sleep. Takes Vitamin D .  Update 09/19/21 SS; Mario Waters is here today for follow-up.  Remains on Keppra  and Dilantin . Last seizure was about 3 years ago. Described as blacking out, generalized seizure activity. Lives with his wife, he drives. Denies any falls, getting around okay. He has numbness from knees to feet, like ants in feet. They aren't painful, isn't bothersome during sleep. Uses cane. No changes to health. Last visit 8/30 Dilantin  level was 19.2, Keppra  level was 14.8. No changes to gait or balance. His wife comes here too as a memory patient.  HISTORY  Update 09/19/20 Dr. Jenel: Mr. Hershberger is an 83 year old right-handed black  male with a history of seizures.  The seizures have been under good control with Dilantin  and Keppra  combination.  He reports a problem with peripheral neuropathy with numbness up to the knees bilaterally.  He has a prior history of cervical spine surgery in the past, he had numbness in the hands and feet prior to surgery, his hand numbness has improved after surgery.  He has a chronic gait disorder, he fell and February 2021 fracturing the left hip, but he has not had any further falls.  He uses a cane when he is outside of the house.  He reports a lot of discomfort in the right knee, around 1 July he had significant  swelling of the knee and has pain with weightbearing.  He has not yet seen anybody about the knee issue.  He has not had a seizure in many years.  He denies issues controlling the bowels or the bladder.  He has no pain from his peripheral neuropathy.  REVIEW OF SYSTEMS: Out of a complete 14 system review of symptoms, the patient complains only of the following symptoms, and all other reviewed systems are negative.  See HPI  ALLERGIES: Allergies  Allergen Reactions   Tramadol      seizure    HOME MEDICATIONS: Outpatient Medications Prior to Visit  Medication Sig Dispense Refill   alfuzosin  (UROXATRAL ) 10 MG 24 hr tablet alfuzosin  ER 10 mg tablet,extended release 24 hr  TAKE 1 TABLET BY MOUTH EVERY DAY     atorvastatin  (LIPITOR) 40 MG tablet Take 40 mg by mouth daily.     HYDROcodone -acetaminophen  (NORCO/VICODIN) 5-325 MG tablet Take 1 tablet by mouth every 4 (four) hours as needed.     Omega-3 Fatty Acids (FISH OIL) 300 MG CAPS Take 300 mg by mouth daily.     pantoprazole  (PROTONIX ) 20 MG tablet Take 20 mg by mouth daily.     tamsulosin  (FLOMAX ) 0.4 MG CAPS capsule Take 0.4 mg by mouth daily.     levETIRAcetam  (KEPPRA ) 750 MG tablet Take 1 tablet (750 mg total) by mouth 2 (two) times daily. 60 tablet 11   phenytoin  (DILANTIN ) 100 MG ER capsule Take 1 capsule (100 mg total) by mouth at bedtime. 30 capsule 11   phenytoin  (DILANTIN ) 30 MG ER capsule Take 1 capsule (30 mg total) by mouth daily. 30 capsule 11   No facility-administered medications prior to visit.    PAST MEDICAL HISTORY: Past Medical History:  Diagnosis Date   Hip fracture (HCC) 02/2019   Neuropathy    Seizure disorder (HCC)    since 1990s. taking Dilantin  200mg  nightly   Seizures (HCC)     PAST SURGICAL HISTORY: Past Surgical History:  Procedure Laterality Date   CATARACT EXTRACTION, BILATERAL     HIP PINNING,CANNULATED Left 03/04/2019   Procedure: HIP PINNING;  Surgeon: Josefina Chew, MD;  Location: [facility] OR;   Service: Orthopedics;  Laterality: Left;   KNEE SURGERY     PENECTOMY     REPAIR OF PERFORATED ULCER     SPINE SURGERY     Cervical laminectomy   TOE SURGERY      FAMILY HISTORY: Family History  Problem Relation Age of Onset   Cancer Sister        breast   Cancer Sister    Seizures Neg Hx     SOCIAL HISTORY: Social History   Socioeconomic History   Marital status: Married    Spouse name: Not on file   Number of children: 4  Years of education: 8+ home study   Highest education level: Not on file  Occupational History   Occupation: N/A  Tobacco Use   Smoking status: Former    Current packs/day: 0.00    Types: Cigarettes    Quit date: 1980    Years since quitting: 45.7   Smokeless tobacco: Never  Vaping Use   Vaping status: Never Used  Substance and Sexual Activity   Alcohol use: No    Comment: Quit 1980   Drug use: No   Sexual activity: Not Currently    Partners: Female    Comment: Married  Other Topics Concern   Not on file  Social History Narrative   Lives at home w/ his wife   Right-handed   Caffeine: 1 cup of coffee per day   Social Drivers of Health   Financial Resource Strain: Not on file  Food Insecurity: Patient Unable To Answer (09/18/2023)   Hunger Vital Sign    Worried About Running Out of Food in the Last Year: Patient unable to answer    Ran Out of Food in the Last Year: Patient unable to answer  Transportation Needs: Patient Unable To Answer (09/18/2023)   PRAPARE - Transportation    Lack of Transportation (Medical): Patient unable to answer    Lack of Transportation (Non-Medical): Patient unable to answer  Physical Activity: Not on file  Stress: Not on file  Social Connections: Patient Unable To Answer (09/18/2023)   Social Connection and Isolation Panel    Frequency of Communication with Friends and Family: Patient unable to answer    Frequency of Social Gatherings with Friends and Family: Patient unable to answer    Attends Religious  Services: Patient unable to answer    Active Member of Clubs or Organizations: Patient unable to answer    Attends Banker Meetings: Patient unable to answer    Marital Status: Patient unable to answer  Intimate Partner Violence: Patient Unable To Answer (09/18/2023)   Humiliation, Afraid, Rape, and Kick questionnaire    Fear of Current or Ex-Partner: Patient unable to answer    Emotionally Abused: Patient unable to answer    Physically Abused: Patient unable to answer    Sexually Abused: Patient unable to answer   PHYSICAL EXAM  Vitals:   10/15/23 1318  BP: 94/60  Pulse: 91  Weight: 130 lb (59 kg)  Height: 5' 11 (1.803 m)    Body mass index is 18.13 kg/m.  Generalized: Well developed, in no acute distress  Neurological examination  Mentation: Alert oriented to time, place, history taking. Follows all commands speech and language fluent Cranial nerve II-XII: Pupils were equal round reactive to light. Extraocular movements were full, visual field were full on confrontational test. Facial sensation and strength were normal. . Head turning and shoulder shrug  were normal and symmetric. Motor: The motor testing reveals 5 over 5 strength of all 4 extremities. Good symmetric motor tone is noted throughout.  Sensory: Length dependent sensory deficit to knee level bilaterally to soft touch Coordination: Cerebellar testing reveals good finger-nose-finger bilaterally, hard time performing heel-to-shin Gait and station: Has to push off from seated position to stand, gait is wide-based, cautious, uses single-point cane in the hallway Reflexes: Deep tendon reflexes are symmetric but decreased to the right knee  DIAGNOSTIC DATA (LABS, IMAGING, TESTING) - I reviewed patient records, labs, notes, testing and imaging myself where available.  Lab Results  Component Value Date   WBC 6.0 09/22/2023  HGB 12.3 (L) 09/22/2023   HCT 35.5 (L) 09/22/2023   MCV 95.4 09/22/2023   PLT 250  09/22/2023      Component Value Date/Time   NA 137 09/22/2023 0451   NA 139 09/17/2022 0912   K 4.5 09/22/2023 0451   CL 98 09/22/2023 0451   CO2 30 09/22/2023 0451   GLUCOSE 118 (H) 09/22/2023 0451   BUN 10 09/22/2023 0451   BUN 9 09/17/2022 0912   CREATININE 0.85 09/22/2023 0451   CREATININE 0.95 09/21/2015 1327   CALCIUM  8.7 (L) 09/22/2023 0451   PROT 6.3 (L) 09/22/2023 0451   PROT 7.4 09/17/2022 0912   ALBUMIN 3.0 (L) 09/22/2023 0451   ALBUMIN 4.5 09/17/2022 0912   AST 53 (H) 09/22/2023 0451   ALT 51 (H) 09/22/2023 0451   ALKPHOS 60 09/22/2023 0451   BILITOT 0.7 09/22/2023 0451   BILITOT 0.3 09/17/2022 0912   GFRNONAA >60 09/22/2023 0451   GFRAA 92 09/20/2019 0822   No results found for: CHOL, HDL, LDLCALC, LDLDIRECT, TRIG, CHOLHDL No results found for: YHAJ8R Lab Results  Component Value Date   VITAMINB12 604 06/03/2016   No results found for: TSH  I have spent a total of 30 minutes dedicated to this patient today, preparing to see patient, performing a medically appropriate examination and evaluation, ordering tests and/or medications and procedures, and counseling and educating the patient/family/caregiver; independently interpreting result and communicating results to the family/patient/caregiver; and documenting clinical information in the electronic medical record.   Pastor Falling, MD  10/15/2023, 1:58 PM [facility] [address], [address] [place], [place] [address] [phone]

## 2023-10-17 ENCOUNTER — Ambulatory Visit: Payer: Self-pay | Admitting: Neurology

## 2023-10-17 LAB — COMPREHENSIVE METABOLIC PANEL WITH GFR
ALT: 16 IU/L (ref 0–44)
AST: 18 IU/L (ref 0–40)
Albumin: 4.8 g/dL — ABNORMAL HIGH (ref 3.7–4.7)
Alkaline Phosphatase: 120 IU/L (ref 48–129)
BUN/Creatinine Ratio: 11 (ref 10–24)
BUN: 10 mg/dL (ref 8–27)
Bilirubin Total: 0.3 mg/dL (ref 0.0–1.2)
CO2: 25 mmol/L (ref 20–29)
Calcium: 9.4 mg/dL (ref 8.6–10.2)
Chloride: 98 mmol/L (ref 96–106)
Creatinine, Ser: 0.87 mg/dL (ref 0.76–1.27)
Globulin, Total: 2.7 g/dL (ref 1.5–4.5)
Glucose: 93 mg/dL (ref 70–99)
Potassium: 5.5 mmol/L — ABNORMAL HIGH (ref 3.5–5.2)
Sodium: 139 mmol/L (ref 134–144)
Total Protein: 7.5 g/dL (ref 6.0–8.5)
eGFR: 85 mL/min/1.73 (ref >59)

## 2023-10-17 LAB — LEVETIRACETAM LEVEL: Levetiracetam Lvl: 59.3 ug/mL — AB (ref 10.0–40.0)

## 2023-10-17 LAB — PHENYTOIN LEVEL, TOTAL: Phenytoin (Dilantin), Serum: 8.2 ug/mL — ABNORMAL LOW (ref 10.0–20.0)

## 2023-10-17 LAB — VITAMIN D 25 HYDROXY (VIT D DEFICIENCY, FRACTURES): Vit D, 25-Hydroxy: 25.8 ng/mL — ABNORMAL LOW (ref 30.0–100.0)

## 2023-10-17 MED ORDER — VITAMIN D (ERGOCALCIFEROL) 1.25 MG (50000 UNIT) PO CAPS: 50000.0000 [IU] | ORAL_CAPSULE | ORAL | 0 refills | Status: AC

## 2023-10-17 NOTE — Progress Notes (Signed)
 Please call and advise the patient that the recent Dilantin  level was low due to the low dose that he is taking. Continue with Dilantin  150 mg nightly. His Vitamin D  was also low. I will prescribed him Vitamin D  supplement to take once a week for the next 12 weeks. Please remind patient to keep any upcoming appointments or tests and to call us  with any interim questions, concerns, problems or updates. Thanks,   Pastor Falling, MD

## 2023-10-20 NOTE — Telephone Encounter (Signed)
-----   Message from [facility] sent at 10/17/2023 10:01 AM EDT ----- Please call and advise the patient that the recent Dilantin  level was low due to the low dose that he is taking. Continue with Dilantin  150 mg nightly. His Vitamin D  was also low. I will prescribed  him Vitamin D  supplement to take once a week for the next 12 weeks. Please remind patient to keep any upcoming appointments or tests and to call us  with any interim questions, concerns, problems or  updates. Thanks,   Pastor Falling, MD   ----- Message ----- From: Rebecka Memos Lab Results In Sent: 10/16/2023   7:38 AM EDT To: Pastor Falling, MD

## 2023-10-20 NOTE — Telephone Encounter (Signed)
 Reviewed results patient, he verbalized understanding.

## 2024-02-20 ENCOUNTER — Encounter (HOSPITAL_COMMUNITY): Payer: Self-pay

## 2024-02-20 ENCOUNTER — Ambulatory Visit (HOSPITAL_COMMUNITY)
Admission: EM | Admit: 2024-02-20 | Discharge: 2024-02-20 | Disposition: A | Discharge status: Home / Self Care | Attending: Emergency Medicine | Admitting: Emergency Medicine

## 2024-02-20 DIAGNOSIS — Z23 Encounter for immunization: Secondary | ICD-10-CM

## 2024-02-20 DIAGNOSIS — S91114A Laceration without foreign body of right lesser toe(s) without damage to nail, initial encounter: Secondary | ICD-10-CM

## 2024-02-20 MED ORDER — AMOXICILLIN-POT CLAVULANATE 875-125 MG PO TABS: 1.0000 | ORAL_TABLET | Freq: Two times a day (BID) | ORAL | 0 refills | Status: AC

## 2024-02-20 MED ORDER — MUPIROCIN 2 % EX OINT: 1.0000 | TOPICAL_OINTMENT | Freq: Two times a day (BID) | CUTANEOUS | 0 refills | Status: AC

## 2024-02-20 MED ORDER — TETANUS-DIPHTH-ACELL PERTUSSIS 5-2-15.5 LF-MCG/0.5 IM SUSP
0.5000 mL | Freq: Once | INTRAMUSCULAR | Status: AC
  Administered 2024-02-20: 0.5 mL via INTRAMUSCULAR

## 2024-02-20 MED ORDER — LIDOCAINE HCL (PF) 1 % IJ SOLN
INTRAMUSCULAR | Status: AC
  Filled 2024-02-20: qty 4

## 2024-02-20 MED ORDER — TETANUS-DIPHTH-ACELL PERTUSSIS 5-2-15.5 LF-MCG/0.5 IM SUSP
INTRAMUSCULAR | Status: AC
  Filled 2024-02-20: qty 0.5

## 2024-02-20 NOTE — ED Triage Notes (Signed)
 Patient's son stated that the patient slid off of the end of the bed and the foot rest of his wheelchair cut under the right 5th toe approx. 2 hours ago. Bleeding controlled.  Patient has not had any medication for his symptoms

## 2024-02-20 NOTE — Discharge Instructions (Addendum)
 Do not get the area wet for the first 24 hours. After this you can gently clean with soap and water and apply mupirocin  ointment twice daily for infection prevention. Start taking Augmentin  twice daily for 7 days for additional infection prevention. Return in 10 days for suture removal. Return sooner if you notice any swelling, redness, or puslike drainage from the area.

## 2024-02-20 NOTE — ED Provider Notes (Signed)
 " [facility]    CSN: [id number] Arrival date & time: 02/20/24  1505      History   Chief Complaint Chief Complaint  Patient presents with   Foot Injury    HPI Autrey Human is a 84 y.o. male.   Patient presents with son for laceration to right foot that began about 2 hours prior to arrival.  Son reports that patient slid off the edge of the bed and he hit his foot on the sharp edge of the foot rest of his wheelchair which caused a laceration.  Son reports that last Tdap was likely more than 10 years ago.  The history is provided by the patient and medical records.  Foot Injury   Past Medical History:  Diagnosis Date   Hip fracture (HCC) 02/2019   Neuropathy    Seizure disorder (HCC)    since 1990s. 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 Authorizing Provider  amoxicillin -clavulanate (AUGMENTIN ) 875-125 MG tablet Take 1 tablet by mouth every 12 (twelve) hours. 02/20/24  Yes Johnie, Ronalee Scheunemann A, NP  mupirocin  ointment (BACTROBAN ) 2 % Apply 1  Application topically 2 (two) times daily. 02/20/24  Yes Johnie, Lovely Kerins A, NP  alfuzosin  (UROXATRAL ) 10 MG 24 hr tablet alfuzosin  ER 10 mg tablet,extended release 24 hr  TAKE 1 TABLET BY MOUTH EVERY DAY    [provider]  atorvastatin  (LIPITOR) 40 MG tablet Take 40 mg by mouth daily.    [provider]  HYDROcodone -acetaminophen  (NORCO/VICODIN) 5-325 MG tablet Take 1 tablet by mouth every 4 (four) hours as needed. 10/06/23   [provider]  levETIRAcetam  (KEPPRA ) 750 MG tablet Take 1 tablet (750 mg total) by mouth 2 (two) times daily. 10/15/23 01/07/25  Camara, Amadou, MD  Omega-3 Fatty Acids (FISH OIL) 300 MG CAPS Take 300 mg by mouth daily.    [provider]  pantoprazole  (PROTONIX ) 20 MG tablet Take 20 mg by mouth daily. 08/13/23   [provider]  phenytoin  (DILANTIN ) 100 MG ER capsule Take 1 capsule (100 mg total) by mouth at bedtime. 10/15/23   Camara, Amadou, MD  phenytoin  (DILANTIN ) 50 MG tablet Chew 1 tablet (50 mg total) by mouth daily. 10/15/23   Camara, Amadou, MD  tamsulosin  (FLOMAX ) 0.4 MG CAPS capsule Take 0.4 mg by mouth daily. 09/18/21   [provider]  Vitamin D , Ergocalciferol , (DRISDOL ) 1.25 MG (50000 UNIT) CAPS capsule Take 1 capsule (50,000 Units total) by mouth  every 7 (seven) days. 10/17/23   Gregg Lek, MD    Family History Family History  Problem Relation Age of Onset   Cancer Sister        breast   Cancer Sister    Seizures Neg Hx     Social History Social History[1]   Allergies   Tramadol    Review of Systems Review of Systems  Per HPI  Physical Exam Triage Vital Signs ED Triage Vitals [02/20/24 1612]  Encounter Vitals Group     BP (!) 153/60     Girls Systolic BP Percentile      Girls Diastolic BP Percentile      Boys Systolic BP Percentile      Boys Diastolic BP Percentile      Pulse Rate 86     Resp 14     Temp 97.6 F (36.4 C)     Temp Source Oral     SpO2 94 %     Weight       Height      Head Circumference      Peak Flow      Pain Score 10     Pain Loc      Pain Education      Exclude from Growth Chart    No data found.  Updated Vital Signs BP (!) 153/60 (BP Location: Right Arm)   Pulse 86   Temp 97.6 F (36.4 C) (Oral)   Resp 14   SpO2 94%   Visual Acuity Right Eye Distance:   Left Eye Distance:   Bilateral Distance:    Right Eye Near:   Left Eye Near:    Bilateral Near:     Physical Exam Vitals and nursing note reviewed.  Constitutional:      General: He is awake. He is not in acute distress.    Appearance: Normal appearance. He is well-developed and well-groomed. He is not ill-appearing.  Skin:    General: Skin is warm and dry.     Findings: Laceration present.     Comments: Approximately 4 cm laceration that wraps around the proximal right little toe  Neurological:     Mental Status: He is alert.  Psychiatric:        Behavior: Behavior is cooperative.             UC Treatments / Results  Labs (all labs ordered are listed, but only abnormal results are displayed) Labs Reviewed - No data to display  EKG   Radiology No results found.  Procedures Laceration Repair  Date/Time: 02/20/2024 5:55 PM  Performed by: Johnie Rumaldo LABOR, NP Authorized by: Johnie Rumaldo LABOR, NP   Consent:    Consent obtained:  Verbal   Consent given by:  Patient   Risks discussed:  Infection, need for additional repair, pain, poor cosmetic result and poor wound healing   Alternatives discussed:  No treatment and delayed treatment Universal protocol:    Procedure explained and questions answered to patient or proxy's satisfaction: yes     Relevant documents present and verified: yes     Patient identity confirmed:  Verbally with patient Anesthesia:    Anesthesia method:  Local infiltration   Local anesthetic:  Lidocaine  2% w/o epi Laceration details:    Location:  Toe   Toe location:  R little toe   Length (cm):  4 Exploration:     Imaging outcome: foreign body not noted     Wound exploration: wound explored through full  range of motion and entire depth of wound visualized   Treatment:    Area cleansed with:  Povidone-iodine  and saline   Amount of cleaning:  Standard Skin repair:    Repair method:  Sutures   Suture size:  4-0   Suture material:  Prolene   Suture technique:  Simple interrupted   Number of sutures:  11 Approximation:    Approximation:  Close Repair type:    Repair type:  Simple Post-procedure details:    Dressing:  Antibiotic ointment and bulky dressing   Procedure completion:  Tolerated well, no immediate complications  (including critical care time)  Medications Ordered in UC Medications  Tdap (ADACEL ) injection 0.5 mL (has no administration in time range)    Initial Impression / Assessment and Plan / UC Course  I have reviewed the triage vital signs and the nursing notes.  Pertinent labs & imaging results that were available during my care of the patient were reviewed by me and considered in my medical decision making (see chart for details).     Patient is overall well-appearing.  Vitals are stable.  Laceration repair performed as documented above.  Patient tolerated this with no immediate complications.  Updated Tdap in clinic.  Prescribed mupirocin  and Augmentin  for infection prevention.  Discussed proper wound care.  Discussed follow-up and strict return precautions. Final Clinical Impressions(s) / UC Diagnoses   Final diagnoses:  Laceration of lesser toe of right foot without foreign body present or damage to nail, initial encounter     Discharge Instructions      Do not get the area wet for the first 24 hours. After this you can gently clean with soap and water and apply mupirocin  ointment twice daily for infection prevention. Start taking Augmentin  twice daily for 7 days for additional infection prevention. Return in 10 days for suture removal. Return sooner if you notice  any swelling, redness, or puslike drainage from the area.   ED Prescriptions     Medication Sig Dispense Auth. Provider   amoxicillin -clavulanate (AUGMENTIN ) 875-125 MG tablet Take 1 tablet by mouth every 12 (twelve) hours. 14 tablet Johnie, Traci Plemons A, NP   mupirocin  ointment (BACTROBAN ) 2 % Apply 1 Application topically 2 (two) times daily. 22 g Johnie Flaming A, NP      PDMP not reviewed this encounter.    [1]  Social History Tobacco Use   Smoking status: Former    Current packs/day: 0.00    Types: Cigarettes    Quit date: 1980    Years since quitting: 46.1   Smokeless tobacco: Never  Vaping Use   Vaping status: Never Used  Substance Use Topics   Alcohol use: No    Comment: Quit 1980   Drug use: No     Johnie Flaming LABOR, NP 02/20/24 1757  "

## 2024-05-06 ENCOUNTER — Ambulatory Visit: Admitting: Neurology

## 2024-10-14 ENCOUNTER — Ambulatory Visit: Admitting: Neurology
